# Patient Record
Sex: Female | Born: 1955
Health system: Southern US, Community
[De-identification: ages and names within clinical notes are randomized; demographics above are authoritative.]

## PROBLEM LIST (undated history)

## (undated) DIAGNOSIS — M1990 Unspecified osteoarthritis, unspecified site: Secondary | ICD-10-CM

## (undated) DIAGNOSIS — T4145XA Adverse effect of unspecified anesthetic, initial encounter: Secondary | ICD-10-CM

## (undated) DIAGNOSIS — R51 Headache: Secondary | ICD-10-CM

## (undated) DIAGNOSIS — T8859XA Other complications of anesthesia, initial encounter: Secondary | ICD-10-CM

## (undated) DIAGNOSIS — K219 Gastro-esophageal reflux disease without esophagitis: Secondary | ICD-10-CM

## (undated) DIAGNOSIS — Z9889 Other specified postprocedural states: Secondary | ICD-10-CM

## (undated) DIAGNOSIS — I341 Nonrheumatic mitral (valve) prolapse: Secondary | ICD-10-CM

## (undated) DIAGNOSIS — R011 Cardiac murmur, unspecified: Secondary | ICD-10-CM

## (undated) DIAGNOSIS — F419 Anxiety disorder, unspecified: Secondary | ICD-10-CM

## (undated) DIAGNOSIS — R519 Headache, unspecified: Secondary | ICD-10-CM

## (undated) DIAGNOSIS — C801 Malignant (primary) neoplasm, unspecified: Secondary | ICD-10-CM

## (undated) DIAGNOSIS — R112 Nausea with vomiting, unspecified: Secondary | ICD-10-CM

## (undated) HISTORY — DX: Unspecified osteoarthritis, unspecified site: M19.90

## (undated) HISTORY — PX: JOINT REPLACEMENT: SHX530

## (undated) HISTORY — PX: OOPHORECTOMY: SHX86

## (undated) HISTORY — PX: FRACTURE SURGERY: SHX138

## (undated) HISTORY — DX: Malignant (primary) neoplasm, unspecified: C80.1

## (undated) HISTORY — PX: TOTAL HIP ARTHROPLASTY: SHX124

---

## 1988-03-31 HISTORY — PX: PELVIC LAPAROSCOPY: SHX162

## 1989-03-31 HISTORY — PX: ABDOMINAL HYSTERECTOMY: SHX81

## 1997-07-10 ENCOUNTER — Other Ambulatory Visit: Admission: RE | Admit: 1997-07-10 | Discharge: 1997-07-10 | Payer: Self-pay | Admitting: Obstetrics and Gynecology

## 1998-08-16 ENCOUNTER — Other Ambulatory Visit: Admission: RE | Admit: 1998-08-16 | Discharge: 1998-08-16 | Payer: Self-pay | Admitting: Obstetrics and Gynecology

## 2001-08-10 ENCOUNTER — Other Ambulatory Visit: Admission: RE | Admit: 2001-08-10 | Discharge: 2001-08-10 | Payer: Self-pay | Admitting: Obstetrics and Gynecology

## 2002-08-23 ENCOUNTER — Other Ambulatory Visit: Admission: RE | Admit: 2002-08-23 | Discharge: 2002-08-23 | Payer: Self-pay | Admitting: Obstetrics and Gynecology

## 2003-05-23 ENCOUNTER — Encounter (HOSPITAL_COMMUNITY): Admission: RE | Admit: 2003-05-23 | Discharge: 2003-08-21 | Payer: Self-pay | Admitting: Radiology

## 2003-08-29 ENCOUNTER — Other Ambulatory Visit: Admission: RE | Admit: 2003-08-29 | Discharge: 2003-08-29 | Payer: Self-pay | Admitting: Obstetrics and Gynecology

## 2004-09-02 ENCOUNTER — Other Ambulatory Visit: Admission: RE | Admit: 2004-09-02 | Discharge: 2004-09-02 | Payer: Self-pay | Admitting: Obstetrics and Gynecology

## 2005-09-03 ENCOUNTER — Other Ambulatory Visit: Admission: RE | Admit: 2005-09-03 | Discharge: 2005-09-03 | Payer: Self-pay | Admitting: Obstetrics and Gynecology

## 2006-04-09 ENCOUNTER — Ambulatory Visit: Payer: Self-pay | Admitting: Internal Medicine

## 2006-05-01 ENCOUNTER — Ambulatory Visit: Payer: Self-pay | Admitting: Internal Medicine

## 2006-09-22 ENCOUNTER — Other Ambulatory Visit: Admission: RE | Admit: 2006-09-22 | Discharge: 2006-09-22 | Payer: Self-pay | Admitting: Obstetrics and Gynecology

## 2007-12-27 ENCOUNTER — Other Ambulatory Visit: Admission: RE | Admit: 2007-12-27 | Discharge: 2007-12-27 | Payer: Self-pay | Admitting: Obstetrics and Gynecology

## 2007-12-27 ENCOUNTER — Ambulatory Visit: Payer: Self-pay | Admitting: Obstetrics and Gynecology

## 2007-12-27 ENCOUNTER — Encounter: Payer: Self-pay | Admitting: Obstetrics and Gynecology

## 2008-04-14 ENCOUNTER — Ambulatory Visit: Payer: Self-pay | Admitting: Unknown Physician Specialty

## 2009-03-31 HISTORY — PX: FACIAL COSMETIC SURGERY: SHX629

## 2009-04-16 ENCOUNTER — Other Ambulatory Visit: Admission: RE | Admit: 2009-04-16 | Discharge: 2009-04-16 | Payer: Self-pay | Admitting: Obstetrics and Gynecology

## 2009-04-16 ENCOUNTER — Ambulatory Visit: Payer: Self-pay | Admitting: Obstetrics and Gynecology

## 2010-03-31 DIAGNOSIS — C801 Malignant (primary) neoplasm, unspecified: Secondary | ICD-10-CM

## 2010-03-31 HISTORY — PX: OTHER SURGICAL HISTORY: SHX169

## 2010-03-31 HISTORY — DX: Malignant (primary) neoplasm, unspecified: C80.1

## 2010-05-23 ENCOUNTER — Encounter: Payer: Self-pay | Admitting: Obstetrics and Gynecology

## 2010-05-29 ENCOUNTER — Other Ambulatory Visit (HOSPITAL_COMMUNITY)
Admission: RE | Admit: 2010-05-29 | Discharge: 2010-05-29 | Disposition: A | Discharge status: Home / Self Care | Payer: BC Managed Care – PPO | Source: Ambulatory Visit | Attending: Obstetrics and Gynecology | Admitting: Obstetrics and Gynecology

## 2010-05-29 ENCOUNTER — Other Ambulatory Visit: Payer: Self-pay | Admitting: Obstetrics and Gynecology

## 2010-05-29 ENCOUNTER — Encounter (INDEPENDENT_AMBULATORY_CARE_PROVIDER_SITE_OTHER): Payer: BC Managed Care – PPO | Admitting: Obstetrics and Gynecology

## 2010-05-29 DIAGNOSIS — Z01419 Encounter for gynecological examination (general) (routine) without abnormal findings: Secondary | ICD-10-CM

## 2010-05-29 DIAGNOSIS — Z124 Encounter for screening for malignant neoplasm of cervix: Secondary | ICD-10-CM | POA: Insufficient documentation

## 2011-01-14 ENCOUNTER — Encounter: Payer: Self-pay | Admitting: Obstetrics and Gynecology

## 2011-06-09 DIAGNOSIS — N809 Endometriosis, unspecified: Secondary | ICD-10-CM | POA: Insufficient documentation

## 2011-06-09 DIAGNOSIS — M1990 Unspecified osteoarthritis, unspecified site: Secondary | ICD-10-CM | POA: Insufficient documentation

## 2011-06-18 ENCOUNTER — Ambulatory Visit (INDEPENDENT_AMBULATORY_CARE_PROVIDER_SITE_OTHER): Payer: BC Managed Care – PPO | Admitting: Obstetrics and Gynecology

## 2011-06-18 ENCOUNTER — Encounter: Payer: Self-pay | Admitting: Obstetrics and Gynecology

## 2011-06-18 VITALS — BP 120/76 | Ht 65.0 in | Wt 122.0 lb

## 2011-06-18 DIAGNOSIS — Z01419 Encounter for gynecological examination (general) (routine) without abnormal findings: Secondary | ICD-10-CM

## 2011-06-18 MED ORDER — ESTRADIOL 2 MG VA RING: 2.0000 mg | VAGINAL_RING | VAGINAL | Status: DC

## 2011-06-18 MED ORDER — ESTRADIOL 1 MG PO TABS: 1.0000 mg | ORAL_TABLET | Freq: Every day | ORAL | Status: DC

## 2011-06-18 NOTE — Patient Instructions (Signed)
Scheduled bone density

## 2011-06-18 NOTE — Progress Notes (Signed)
Patient came to see me today for her annual GYN exam. She continues with the Estring with excellent results. She's also doing oral estradiol for sense of well-being, joint pain, and bone protection. She does that about 3 times a week. I couldn't really get a feeling from her whether she thought it was  making a difference. She's having no vaginal bleeding. She is having no pelvic pain. She is up-to-date on mammograms. When she came in last year we discussed drug holiday from Fosamax as she had been on more than 5 years. Dr. Hyacinth Meeker who was given to her want her to continue. She however decided to stop. She has avascular necrosis of the hip and the orthopedist was concerned with her continuing as well. She is due for followup bone density.  HEENT: Within normal limits.  Kennon Portela present. Neck: No masses. Supraclavicular lymph nodes: Not enlarged. Breasts: Examined in both sitting and lying position. Symmetrical without skin changes or masses. Abdomen: Soft no masses guarding or rebound. No hernias. Pelvic: External within normal limits. BUS within normal limits. Vaginal examination shows good estrogen effect, no cystocele enterocele or rectocele. Cervix and uterus absent. Adnexa within normal limits. Rectovaginal confirmatory. Extremities within normal limits.  Assessment: #1. Atrophic vaginitis #2. Menopausal symptoms #3. Osteoporosis  Plan: Continue yearly mammograms. Followup bone density. Continue Estring vaginal ring. Patient to discontinue oral estradiol for about a month to see if it's making a difference. It is she will reinstitute it and take the smallest dose which is effective.

## 2011-06-19 LAB — URINALYSIS W MICROSCOPIC + REFLEX CULTURE
Casts: NONE SEEN
Crystals: NONE SEEN
Leukocytes, UA: NEGATIVE
Nitrite: NEGATIVE
Specific Gravity, Urine: 1.011 (ref 1.005–1.030)
Urobilinogen, UA: 0.2 mg/dL (ref 0.0–1.0)
pH: 7.5 (ref 5.0–8.0)

## 2012-01-13 ENCOUNTER — Encounter: Payer: Self-pay | Admitting: Obstetrics and Gynecology

## 2012-10-27 ENCOUNTER — Ambulatory Visit (INDEPENDENT_AMBULATORY_CARE_PROVIDER_SITE_OTHER): Payer: BC Managed Care – PPO | Admitting: Women's Health

## 2012-10-27 ENCOUNTER — Encounter: Payer: Self-pay | Admitting: Women's Health

## 2012-10-27 VITALS — BP 112/70 | Ht 64.75 in | Wt 126.0 lb

## 2012-10-27 DIAGNOSIS — Z01419 Encounter for gynecological examination (general) (routine) without abnormal findings: Secondary | ICD-10-CM

## 2012-10-27 NOTE — Progress Notes (Signed)
Jennifer Hammond 14-Feb-1956 [id number]    History:    The patient presents for annual exam.  TAH with BSO 1991 for endometriosis. On 1 mg estradiol 2-3 times a week and Estring. Melanoma 2012, upper chest with every six-month followup. History of osteoporosis was on Fosamax for 5 years stopped in 2013. DEXA followed [facility] clinic. History of a benign colon polyp due next year for repeat colonoscopy. Normal mammograms, had a right breast cyst that was followed in 2006, has had 3D tomography with mammograms due to density.   Past medical history, past surgical history, family history and social history were all reviewed and documented in the EPIC chart. Works in Community education officer at [employer]. Husband a rectal dysfunction. 2 adopted children ages 68 and 85 doing well.   ROS:  A  ROS was performed and pertinent positives and negatives are included in the history.  Exam:  Filed Vitals:   10/27/12 1200  BP: 112/70    General appearance:  Normal Head/Neck:  Normal, without cervical or supraclavicular adenopathy. Thyroid:  Symmetrical, normal in size, without palpable masses or nodularity. Respiratory  Effort:  Normal  Auscultation:  Clear without wheezing or rhonchi Cardiovascular  Auscultation:  Regular rate, without rubs, murmurs or gallops  Edema/varicosities:  Not grossly evident Abdominal  Soft,nontender, without masses, guarding or rebound.  Liver/spleen:  No organomegaly noted  Hernia:  None appreciated  Skin  Inspection:  Grossly normal  Palpation:  Grossly normal Neurologic/psychiatric  Orientation:  Normal with appropriate conversation.  Mood/affect:  Normal  Genitourinary    Breasts: Examined lying and sitting.     Right: Without masses, retractions, discharge or axillary adenopathy.     Left: Without masses, retractions, discharge or axillary adenopathy.   Inguinal/mons:  Normal without inguinal adenopathy  External genitalia:  Normal  BUS/Urethra/Skene's glands:   Normal  Bladder:  Normal  Vagina:  Normal  Cervix:  absent  Uterus:  absent Adnexa/parametria:     Rt: Without masses or tenderness.   Lt: Without masses or tenderness.  Anus and perineum: Normal  Digital rectal exam: Normal sphincter tone without palpated masses or tenderness  Assessment/Plan:  57 y.o. M. WF G0, 2 adopted for annual exam with no complaints.  TAH with BSO endometriosis on Estring and estradiol Osteoporosis /osteoarthritis/ followed by primary care- labs and meds  Plan: Estring 2 mg every 3 months, prescription, proper use reviewed, states prevents dryness with itching. Estradiol 1 mg states feels better when she takes several times per week. Reviewed slight risk for blood clots, strokes, breast cancer. SBE's, continue annual mammogram, continue 3-D tomography due to density of breasts. Continue regular exercise, calcium rich diet and vitamin D 2000 daily.   Harrington Challenger WHNP, 1:00 PM 10/27/2012

## 2012-10-27 NOTE — Patient Instructions (Signed)
Health Recommendations for Postmenopausal Women Respected and ongoing research has looked at the most common causes of death, disability, and poor quality of life in postmenopausal women. The causes include heart disease, diseases of blood vessels, diabetes, depression, cancer, and bone loss (osteoporosis). Many things can be done to help lower the chances of developing these and other common problems: CARDIOVASCULAR DISEASE Heart Disease: A heart attack is a medical emergency. Know the signs and symptoms of a heart attack. Below are things women can do to reduce their risk for heart disease.   Do not smoke. If you smoke, quit.  Aim for a healthy weight. Being overweight causes many preventable deaths. Eat a healthy and balanced diet and drink an adequate amount of liquids.  Get moving. Make a commitment to be more physically active. Aim for 30 minutes of activity on most, if not all days of the week.  Eat for heart health. Choose a diet that is low in saturated fat and cholesterol and eliminate trans fat. Include whole grains, vegetables, and fruits. Read and understand the labels on food containers before buying.  Know your numbers. Ask your caregiver to check your blood pressure, cholesterol (total, HDL, LDL, triglycerides) and blood glucose. Work with your caregiver on improving your entire clinical picture.  High blood pressure. Limit or stop your table salt intake (try salt substitute and food seasonings). Avoid salty foods and drinks. Read labels on food containers before buying. Eating well and exercising can help control high blood pressure. STROKE  Stroke is a medical emergency. Stroke may be the result of a blood clot in a blood vessel in the brain or by a brain hemorrhage (bleeding). Know the signs and symptoms of a stroke. To lower the risk of developing a stroke:  Avoid fatty foods.  Quit smoking.  Control your diabetes, blood pressure, and irregular heart rate. THROMBOPHLEBITIS  (BLOOD CLOT) OF THE LEG  Becoming overweight and leading a stationary lifestyle may also contribute to developing blood clots. Controlling your diet and exercising will help lower the risk of developing blood clots. CANCER SCREENING  Breast Cancer: Take steps to reduce your risk of breast cancer.  You should practice "breast self-awareness." This means understanding the normal appearance and feel of your breasts and should include breast self-examination. Any changes detected, no matter how small, should be reported to your caregiver.  After age 40, you should have a clinical breast exam (CBE) every year.  Starting at age 40, you should consider having a mammogram (breast X-ray) every year.  If you have a family history of breast cancer, talk to your caregiver about genetic screening.  If you are at high risk for breast cancer, talk to your caregiver about having an MRI and a mammogram every year.  Intestinal or Stomach Cancer: Tests to consider are a rectal exam, fecal occult blood, sigmoidoscopy, and colonoscopy. Women who are high risk may need to be screened at an earlier age and more often.  Cervical Cancer:  Beginning at age 30, you should have a Pap test every 3 years as long as the past 3 Pap tests have been normal.  If you have had past treatment for cervical cancer or a condition that could lead to cancer, you need Pap tests and screening for cancer for at least 20 years after your treatment.  If you had a hysterectomy for a problem that was not cancer or a condition that could lead to cancer, then you no longer need Pap tests.    If you are between ages 65 and 70, and you have had normal Pap tests going back 10 years, you no longer need Pap tests.  If Pap tests have been discontinued, risk factors (such as a new sexual partner) need to be reassessed to determine if screening should be resumed.  Some medical problems can increase the chance of getting cervical cancer. In these  cases, your caregiver may recommend more frequent screening and Pap tests.  Uterine Cancer: If you have vaginal bleeding after reaching menopause, you should notify your caregiver.  Ovarian cancer: Other than yearly pelvic exams, there are no reliable tests available to screen for ovarian cancer at this time except for yearly pelvic exams.  Lung Cancer: Yearly chest X-rays can detect lung cancer and should be done on high risk women, such as cigarette smokers and women with chronic lung disease (emphysema).  Skin Cancer: A complete body skin exam should be done at your yearly examination. Avoid overexposure to the sun and ultraviolet light lamps. Use a strong sun block cream when in the sun. All of these things are important in lowering the risk of skin cancer. MENOPAUSE Menopause Symptoms: Hormone therapy products are effective for treating symptoms associated with menopause:  Moderate to severe hot flashes.  Night sweats.  Mood swings.  Headaches.  Tiredness.  Loss of sex drive.  Insomnia.  Other symptoms. Hormone replacement carries certain risks, especially in older women. Women who use or are thinking about using estrogen or estrogen with progestin treatments should discuss that with their caregiver. Your caregiver will help you understand the benefits and risks. The ideal dose of hormone replacement therapy is not known. The Food and Drug Administration (FDA) has concluded that hormone therapy should be used only at the lowest doses and for the shortest amount of time to reach treatment goals.  OSTEOPOROSIS Protecting Against Bone Loss and Preventing Fracture: If you use hormone therapy for prevention of bone loss (osteoporosis), the risks for bone loss must outweigh the risk of the therapy. Ask your caregiver about other medications known to be safe and effective for preventing bone loss and fractures. To guard against bone loss or fractures, the following is recommended:  If  you are less than age 50, take 1000 mg of calcium and at least 600 mg of Vitamin D per day.  If you are greater than age 50 but less than age 70, take 1200 mg of calcium and at least 600 mg of Vitamin D per day.  If you are greater than age 70, take 1200 mg of calcium and at least 800 mg of Vitamin D per day. Smoking and excessive alcohol intake increases the risk of osteoporosis. Eat foods rich in calcium and vitamin D and do weight bearing exercises several times a week as your caregiver suggests. DIABETES Diabetes Melitus: If you have Type I or Type 2 diabetes, you should keep your blood sugar under control with diet, exercise and recommended medication. Avoid too many sweets, starchy and fatty foods. Being overweight can make control more difficult. COGNITION AND MEMORY Cognition and Memory: Menopausal hormone therapy is not recommended for the prevention of cognitive disorders such as Alzheimer's disease or memory loss.  DEPRESSION  Depression may occur at any age, but is common in elderly women. The reasons may be because of physical, medical, social (loneliness), or financial problems and needs. If you are experiencing depression because of medical problems and control of symptoms, talk to your caregiver about this. Physical activity and   exercise may help with mood and sleep. Community and volunteer involvement may help your sense of value and worth. If you have depression and you feel that the problem is getting worse or becoming severe, talk to your caregiver about treatment options that are best for you. ACCIDENTS  Accidents are common and can be serious in the elderly woman. Prepare your house to prevent accidents. Eliminate throw rugs, place hand bars in the bath, shower and toilet areas. Avoid wearing high heeled shoes or walking on wet, snowy, and icy areas. Limit or stop driving if you have vision or hearing problems, or you feel you are unsteady with you movements and  reflexes. HEPATITIS C Hepatitis C is a type of viral infection affecting the liver. It is spread mainly through contact with blood from an infected person. It can be treated, but if left untreated, it can lead to severe liver damage over years. Many people who are infected do not know that the virus is in their blood. If you are a "baby-boomer", it is recommended that you have one screening test for Hepatitis C. IMMUNIZATIONS  Several immunizations are important to consider having during your senior years, including:   Tetanus, diptheria, and pertussis booster shot.  Influenza every year before the flu season begins.  Pneumonia vaccine.  Shingles vaccine.  Others as indicated based on your specific needs. Talk to your caregiver about these. Document Released: 05/09/2005 Document Revised: 03/03/2012 Document Reviewed: 01/03/2008 Exit[employer] Patient Information 2014 [employer].  

## 2013-04-26 ENCOUNTER — Telehealth: Payer: Self-pay | Admitting: *Deleted

## 2013-04-26 NOTE — Telephone Encounter (Signed)
Pt called requesting refill on estring 2 mg, pt annual due in July 2015, left message for pt to call with which pharmacy to send Rx.

## 2013-04-28 MED ORDER — ESTRADIOL 2 MG VA RING
2.0000 mg | VAGINAL_RING | VAGINAL | Status: DC
Start: 2013-04-28 — End: 2013-11-04

## 2013-04-28 NOTE — Telephone Encounter (Signed)
Pt called back and would like rx sent to mail order. rx sent

## 2013-06-21 ENCOUNTER — Encounter: Payer: Self-pay | Admitting: Women's Health

## 2013-07-11 ENCOUNTER — Ambulatory Visit: Payer: Self-pay | Admitting: Unknown Physician Specialty

## 2013-07-14 LAB — PATHOLOGY REPORT

## 2013-11-04 ENCOUNTER — Encounter: Payer: Self-pay | Admitting: Women's Health

## 2013-11-04 ENCOUNTER — Ambulatory Visit (INDEPENDENT_AMBULATORY_CARE_PROVIDER_SITE_OTHER): Payer: BC Managed Care – PPO | Admitting: Women's Health

## 2013-11-04 VITALS — BP 110/68 | Ht 65.0 in | Wt 123.4 lb

## 2013-11-04 DIAGNOSIS — Z01419 Encounter for gynecological examination (general) (routine) without abnormal findings: Secondary | ICD-10-CM

## 2013-11-04 DIAGNOSIS — Z79890 Hormone replacement therapy (postmenopausal): Secondary | ICD-10-CM

## 2013-11-04 MED ORDER — ESTRADIOL 1 MG PO TABS: 1.0000 mg | ORAL_TABLET | Freq: Every day | ORAL | Status: DC

## 2013-11-04 MED ORDER — ESTRADIOL 2 MG VA RING: 2.0000 mg | VAGINAL_RING | VAGINAL | Status: DC

## 2013-11-04 NOTE — Progress Notes (Signed)
Jennifer Hammond [birth year] [id number]    History:    Presents for annual exam.  1991 TAH with BSO for endometriosis on estradiol 1 mg 2-3 times weekly and Estring. Normal Pap and mammogram history. Melanoma 2012 every six-month skin checks. DEXA primary care for osteoporosis, Fosamax for 5 years until 2013. Not sexually active has been with erectile dysfunction. Negative colonoscopy 2015.  Past medical history, past surgical history, family history and social history were all reviewed and documented in the EPIC chart. Works at [employer], residence living. 2 adopted children ages 45 and 72.  ROS:  A  12 point ROS was performed and pertinent positives and negatives are included.  Exam:  Filed Vitals:   11/04/13 0925  BP: 110/68    General appearance:  Normal Thyroid:  Symmetrical, normal in size, without palpable masses or nodularity. Respiratory  Auscultation:  Clear without wheezing or rhonchi Cardiovascular  Auscultation:  Regular rate, without rubs, murmurs or gallops  Edema/varicosities:  Not grossly evident Abdominal  Soft,nontender, without masses, guarding or rebound.  Liver/spleen:  No organomegaly noted  Hernia:  None appreciated  Skin  Inspection:  Grossly normal   Breasts: Examined lying and sitting.     Right: Without masses, retractions, discharge or axillary adenopathy.     Left: Without masses, retractions, discharge or axillary adenopathy. Gentitourinary   Inguinal/mons:  Normal without inguinal adenopathy  External genitalia:  Normal  BUS/Urethra/Skene's glands:  Normal  Vagina:  Normal  Cervix: Absent  Uterus:  Absent Adnexa/parametria:     Rt: Without masses or tenderness.   Lt: Without masses or tenderness.  Anus and perineum: Normal  Digital rectal exam: Normal sphincter tone without palpated masses or tenderness  Assessment/Plan:  58 y.o. MWF G0 +2 adopted children for annual exam with no complaints.  TAH with BSO for endometriosis on estradiol and  Estring Melanoma  Osteoporosis-primary care  Plan:. Estradiol 1 mg to 3 times weekly reviewed taking half tablet 3 times weekly to gradually decreased to stop HRT, reviewed blood clots, strokes, breast cancer risk with HRT. Estring 2 mg every 12 weeks prescription, proper use given and reviewed. SBE's, continue annual mammogram 3-D tomography history of dense breast. Regular exercise, calcium rich diet, vitamin D 2000 daily encouraged. Continue every six-month skin check. Home safety, fall prevention and importance of regular exercise for bone health reviewed.   Note: This dictation was prepared with Dragon/digital dictation.  Any transcriptional errors that result are unintentional. Huel Cote [facility], 1:43 PM 11/04/2013

## 2013-11-04 NOTE — Patient Instructions (Signed)
Health Recommendations for Postmenopausal Women Respected and ongoing research has looked at the most common causes of death, disability, and poor quality of life in postmenopausal women. The causes include heart disease, diseases of blood vessels, diabetes, depression, cancer, and bone loss (osteoporosis). Many things can be done to help lower the chances of developing these and other common problems. CARDIOVASCULAR DISEASE Heart Disease: A heart attack is a medical emergency. Know the signs and symptoms of a heart attack. Below are things women can do to reduce their risk for heart disease.   Do not smoke. If you smoke, quit.  Aim for a healthy weight. Being overweight causes many preventable deaths. Eat a healthy and balanced diet and drink an adequate amount of liquids.  Get moving. Make a commitment to be more physically active. Aim for 30 minutes of activity on most, if not all days of the week.  Eat for heart health. Choose a diet that is low in saturated fat and cholesterol and eliminate trans fat. Include whole grains, vegetables, and fruits. Read and understand the labels on food containers before buying.  Know your numbers. Ask your caregiver to check your blood pressure, cholesterol (total, HDL, LDL, triglycerides) and blood glucose. Work with your caregiver on improving your entire clinical picture.  High blood pressure. Limit or stop your table salt intake (try salt substitute and food seasonings). Avoid salty foods and drinks. Read labels on food containers before buying. Eating well and exercising can help control high blood pressure. STROKE  Stroke is a medical emergency. Stroke may be the result of a blood clot in a blood vessel in the brain or by a brain hemorrhage (bleeding). Know the signs and symptoms of a stroke. To lower the risk of developing a stroke:  Avoid fatty foods.  Quit smoking.  Control your diabetes, blood pressure, and irregular heart rate. THROMBOPHLEBITIS  (BLOOD CLOT) OF THE LEG  Becoming overweight and leading a stationary lifestyle may also contribute to developing blood clots. Controlling your diet and exercising will help lower the risk of developing blood clots. CANCER SCREENING  Breast Cancer: Take steps to reduce your risk of breast cancer.  You should practice "breast self-awareness." This means understanding the normal appearance and feel of your breasts and should include breast self-examination. Any changes detected, no matter how small, should be reported to your caregiver.  After age 40, you should have a clinical breast exam (CBE) every year.  Starting at age 40, you should consider having a mammogram (breast X-ray) every year.  If you have a family history of breast cancer, talk to your caregiver about genetic screening.  If you are at high risk for breast cancer, talk to your caregiver about having an MRI and a mammogram every year.  Intestinal or Stomach Cancer: Tests to consider are a rectal exam, fecal occult blood, sigmoidoscopy, and colonoscopy. Women who are high risk may need to be screened at an earlier age and more often.  Cervical Cancer:  Beginning at age 30, you should have a Pap test every 3 years as long as the past 3 Pap tests have been normal.  If you have had past treatment for cervical cancer or a condition that could lead to cancer, you need Pap tests and screening for cancer for at least 20 years after your treatment.  If you had a hysterectomy for a problem that was not cancer or a condition that could lead to cancer, then you no longer need Pap tests.    If you are between ages 65 and 70, and you have had normal Pap tests going back 10 years, you no longer need Pap tests.  If Pap tests have been discontinued, risk factors (such as a new sexual partner) need to be reassessed to determine if screening should be resumed.  Some medical problems can increase the chance of getting cervical cancer. In these  cases, your caregiver may recommend more frequent screening and Pap tests.  Uterine Cancer: If you have vaginal bleeding after reaching menopause, you should notify your caregiver.  Ovarian Cancer: Other than yearly pelvic exams, there are no reliable tests available to screen for ovarian cancer at this time except for yearly pelvic exams.  Lung Cancer: Yearly chest X-rays can detect lung cancer and should be done on high risk women, such as cigarette smokers and women with chronic lung disease (emphysema).  Skin Cancer: A complete body skin exam should be done at your yearly examination. Avoid overexposure to the sun and ultraviolet light lamps. Use a strong sun block cream when in the sun. All of these things are important for lowering the risk of skin cancer. MENOPAUSE Menopause Symptoms: Hormone therapy products are effective for treating symptoms associated with menopause:  Moderate to severe hot flashes.  Night sweats.  Mood swings.  Headaches.  Tiredness.  Loss of sex drive.  Insomnia.  Other symptoms. Hormone replacement carries certain risks, especially in older women. Women who use or are thinking about using estrogen or estrogen with progestin treatments should discuss that with their caregiver. Your caregiver will help you understand the benefits and risks. The ideal dose of hormone replacement therapy is not known. The Food and Drug Administration (FDA) has concluded that hormone therapy should be used only at the lowest doses and for the shortest amount of time to reach treatment goals.  OSTEOPOROSIS Protecting Against Bone Loss and Preventing Fracture If you use hormone therapy for prevention of bone loss (osteoporosis), the risks for bone loss must outweigh the risk of the therapy. Ask your caregiver about other medications known to be safe and effective for preventing bone loss and fractures. To guard against bone loss or fractures, the following is recommended:  If  you are younger than age 50, take 1000 mg of calcium and at least 600 mg of Vitamin D per day.  If you are older than age 50 but younger than age 70, take 1200 mg of calcium and at least 600 mg of Vitamin D per day.  If you are older than age 70, take 1200 mg of calcium and at least 800 mg of Vitamin D per day. Smoking and excessive alcohol intake increases the risk of osteoporosis. Eat foods rich in calcium and vitamin D and do weight bearing exercises several times a week as your caregiver suggests. DIABETES Diabetes Mellitus: If you have type I or type 2 diabetes, you should keep your blood sugar under control with diet, exercise, and recommended medication. Avoid starchy and fatty foods, and too many sweets. Being overweight can make diabetes control more difficult. COGNITION AND MEMORY Cognition and Memory: Menopausal hormone therapy is not recommended for the prevention of cognitive disorders such as Alzheimer's disease or memory loss.  DEPRESSION  Depression may occur at any age, but it is common in elderly women. This may be because of physical, medical, social (loneliness), or financial problems and needs. If you are experiencing depression because of medical problems and control of symptoms, talk to your caregiver about this. Physical   activity and exercise may help with mood and sleep. Community and volunteer involvement may improve your sense of value and worth. If you have depression and you feel that the problem is getting worse or becoming severe, talk to your caregiver about which treatment options are best for you. ACCIDENTS  Accidents are common and can be serious in elderly woman. Prepare your house to prevent accidents. Eliminate throw rugs, place hand bars in bath, shower, and toilet areas. Avoid wearing high heeled shoes or walking on wet, snowy, and icy areas. Limit or stop driving if you have vision or hearing problems, or if you feel you are unsteady with your movements and  reflexes. HEPATITIS C Hepatitis C is a type of viral infection affecting the liver. It is spread mainly through contact with blood from an infected person. It can be treated, but if left untreated, it can lead to severe liver damage over the years. Many people who are infected do not know that the virus is in their blood. If you are a "baby-boomer", it is recommended that you have one screening test for Hepatitis C. IMMUNIZATIONS  Several immunizations are important to consider having during your senior years, including:   Tetanus, diphtheria, and pertussis booster shot.  Influenza every year before the flu season begins.  Pneumonia vaccine.  Shingles vaccine.  Others, as indicated based on your specific needs. Talk to your caregiver about these. Document Released: 05/09/2005 Document Revised: 08/01/2013 Document Reviewed: 01/03/2008 [employer] Patient Information 2015 [employer]. This information is not intended to replace advice given to you by your health care provider. Make sure you discuss any questions you have with your health care provider.  

## 2014-07-28 ENCOUNTER — Encounter: Payer: Self-pay | Admitting: Women's Health

## 2014-11-06 ENCOUNTER — Ambulatory Visit (INDEPENDENT_AMBULATORY_CARE_PROVIDER_SITE_OTHER): Payer: BLUE CROSS/BLUE SHIELD | Admitting: Women's Health

## 2014-11-06 ENCOUNTER — Encounter: Payer: Self-pay | Admitting: Women's Health

## 2014-11-06 VITALS — BP 120/78 | Ht 64.5 in | Wt 126.0 lb

## 2014-11-06 DIAGNOSIS — Z79890 Hormone replacement therapy (postmenopausal): Secondary | ICD-10-CM | POA: Diagnosis not present

## 2014-11-06 DIAGNOSIS — Z01419 Encounter for gynecological examination (general) (routine) without abnormal findings: Secondary | ICD-10-CM | POA: Diagnosis not present

## 2014-11-06 MED ORDER — ESTRADIOL 2 MG VA RING: 2.0000 mg | VAGINAL_RING | VAGINAL | Status: DC

## 2014-11-06 NOTE — Patient Instructions (Signed)
Health Recommendations for Postmenopausal Women Respected and ongoing research has looked at the most common causes of death, disability, and poor quality of life in postmenopausal women. The causes include heart disease, diseases of blood vessels, diabetes, depression, cancer, and bone loss (osteoporosis). Many things can be done to help lower the chances of developing these and other common problems. CARDIOVASCULAR DISEASE Heart Disease: A heart attack is a medical emergency. Know the signs and symptoms of a heart attack. Below are things women can do to reduce their risk for heart disease.   Do not smoke. If you smoke, quit.  Aim for a healthy weight. Being overweight causes many preventable deaths. Eat a healthy and balanced diet and drink an adequate amount of liquids.  Get moving. Make a commitment to be more physically active. Aim for 30 minutes of activity on most, if not all days of the week.  Eat for heart health. Choose a diet that is low in saturated fat and cholesterol and eliminate trans fat. Include whole grains, vegetables, and fruits. Read and understand the labels on food containers before buying.  Know your numbers. Ask your caregiver to check your blood pressure, cholesterol (total, HDL, LDL, triglycerides) and blood glucose. Work with your caregiver on improving your entire clinical picture.  High blood pressure. Limit or stop your table salt intake (try salt substitute and food seasonings). Avoid salty foods and drinks. Read labels on food containers before buying. Eating well and exercising can help control high blood pressure. STROKE  Stroke is a medical emergency. Stroke may be the result of a blood clot in a blood vessel in the brain or by a brain hemorrhage (bleeding). Know the signs and symptoms of a stroke. To lower the risk of developing a stroke:  Avoid fatty foods.  Quit smoking.  Control your diabetes, blood pressure, and irregular heart rate. THROMBOPHLEBITIS  (BLOOD CLOT) OF THE LEG  Becoming overweight and leading a stationary lifestyle may also contribute to developing blood clots. Controlling your diet and exercising will help lower the risk of developing blood clots. CANCER SCREENING  Breast Cancer: Take steps to reduce your risk of breast cancer.  You should practice "breast self-awareness." This means understanding the normal appearance and feel of your breasts and should include breast self-examination. Any changes detected, no matter how small, should be reported to your caregiver.  After age 40, you should have a clinical breast exam (CBE) every year.  Starting at age 40, you should consider having a mammogram (breast X-ray) every year.  If you have a family history of breast cancer, talk to your caregiver about genetic screening.  If you are at high risk for breast cancer, talk to your caregiver about having an MRI and a mammogram every year.  Intestinal or Stomach Cancer: Tests to consider are a rectal exam, fecal occult blood, sigmoidoscopy, and colonoscopy. Women who are high risk may need to be screened at an earlier age and more often.  Cervical Cancer:  Beginning at age 30, you should have a Pap test every 3 years as long as the past 3 Pap tests have been normal.  If you have had past treatment for cervical cancer or a condition that could lead to cancer, you need Pap tests and screening for cancer for at least 20 years after your treatment.  If you had a hysterectomy for a problem that was not cancer or a condition that could lead to cancer, then you no longer need Pap tests.    If you are between ages 65 and 70, and you have had normal Pap tests going back 10 years, you no longer need Pap tests.  If Pap tests have been discontinued, risk factors (such as a new sexual partner) need to be reassessed to determine if screening should be resumed.  Some medical problems can increase the chance of getting cervical cancer. In these  cases, your caregiver may recommend more frequent screening and Pap tests.  Uterine Cancer: If you have vaginal bleeding after reaching menopause, you should notify your caregiver.  Ovarian Cancer: Other than yearly pelvic exams, there are no reliable tests available to screen for ovarian cancer at this time except for yearly pelvic exams.  Lung Cancer: Yearly chest X-rays can detect lung cancer and should be done on high risk women, such as cigarette smokers and women with chronic lung disease (emphysema).  Skin Cancer: A complete body skin exam should be done at your yearly examination. Avoid overexposure to the sun and ultraviolet light lamps. Use a strong sun block cream when in the sun. All of these things are important for lowering the risk of skin cancer. MENOPAUSE Menopause Symptoms: Hormone therapy products are effective for treating symptoms associated with menopause:  Moderate to severe hot flashes.  Night sweats.  Mood swings.  Headaches.  Tiredness.  Loss of sex drive.  Insomnia.  Other symptoms. Hormone replacement carries certain risks, especially in older women. Women who use or are thinking about using estrogen or estrogen with progestin treatments should discuss that with their caregiver. Your caregiver will help you understand the benefits and risks. The ideal dose of hormone replacement therapy is not known. The Food and Drug Administration (FDA) has concluded that hormone therapy should be used only at the lowest doses and for the shortest amount of time to reach treatment goals.  OSTEOPOROSIS Protecting Against Bone Loss and Preventing Fracture If you use hormone therapy for prevention of bone loss (osteoporosis), the risks for bone loss must outweigh the risk of the therapy. Ask your caregiver about other medications known to be safe and effective for preventing bone loss and fractures. To guard against bone loss or fractures, the following is recommended:  If  you are younger than age 50, take 1000 mg of calcium and at least 600 mg of Vitamin D per day.  If you are older than age 50 but younger than age 70, take 1200 mg of calcium and at least 600 mg of Vitamin D per day.  If you are older than age 70, take 1200 mg of calcium and at least 800 mg of Vitamin D per day. Smoking and excessive alcohol intake increases the risk of osteoporosis. Eat foods rich in calcium and vitamin D and do weight bearing exercises several times a week as your caregiver suggests. DIABETES Diabetes Mellitus: If you have type I or type 2 diabetes, you should keep your blood sugar under control with diet, exercise, and recommended medication. Avoid starchy and fatty foods, and too many sweets. Being overweight can make diabetes control more difficult. COGNITION AND MEMORY Cognition and Memory: Menopausal hormone therapy is not recommended for the prevention of cognitive disorders such as Alzheimer's disease or memory loss.  DEPRESSION  Depression may occur at any age, but it is common in elderly women. This may be because of physical, medical, social (loneliness), or financial problems and needs. If you are experiencing depression because of medical problems and control of symptoms, talk to your caregiver about this. Physical   activity and exercise may help with mood and sleep. Community and volunteer involvement may improve your sense of value and worth. If you have depression and you feel that the problem is getting worse or becoming severe, talk to your caregiver about which treatment options are best for you. ACCIDENTS  Accidents are common and can be serious in elderly woman. Prepare your house to prevent accidents. Eliminate throw rugs, place hand bars in bath, shower, and toilet areas. Avoid wearing high heeled shoes or walking on wet, snowy, and icy areas. Limit or stop driving if you have vision or hearing problems, or if you feel you are unsteady with your movements and  reflexes. HEPATITIS C Hepatitis C is a type of viral infection affecting the liver. It is spread mainly through contact with blood from an infected person. It can be treated, but if left untreated, it can lead to severe liver damage over the years. Many people who are infected do not know that the virus is in their blood. If you are a "baby-boomer", it is recommended that you have one screening test for Hepatitis C. IMMUNIZATIONS  Several immunizations are important to consider having during your senior years, including:   Tetanus, diphtheria, and pertussis booster shot.  Influenza every year before the flu season begins.  Pneumonia vaccine.  Shingles vaccine.  Others, as indicated based on your specific needs. Talk to your caregiver about these. Document Released: 05/09/2005 Document Revised: 08/01/2013 Document Reviewed: 01/03/2008 [employer] Patient Information 2015 [employer]. This information is not intended to replace advice given to you by your health care provider. Make sure you discuss any questions you have with your health care provider.  

## 2014-11-06 NOTE — Progress Notes (Signed)
Jennifer Hammond Aug 12, 1955 [id number]    History:    Presents for annual exam.  1991 TAH with BSO for endometriosis on Estring. Had a hip replacement/osteoarthritis in May,  no estrogen prior to surgery, started back 1 month after for dryness and hot flushes. Normal Pap and mammogram history. 2012 melanoma has skin checks every 6 months. History of osteoporosis had been on Fosamax for 5 years primary care manages. 2015 negative colonoscopy.  Past medical history, past surgical history, family history and social history were all reviewed and documented in the EPIC chart. Works at [employer], residence  living. Has 2 adopted children ages 56 and 7 both doing well.  ROS:  A ROS was performed and pertinent positives and negatives are included.  Exam:  Filed Vitals:   11/06/14 0901  BP: 120/78    General appearance:  Normal Thyroid:  Symmetrical, normal in size, without palpable masses or nodularity. Respiratory  Auscultation:  Clear without wheezing or rhonchi Cardiovascular  Auscultation:  Regular rate, without rubs, murmurs or gallops  Edema/varicosities:  Not grossly evident Abdominal  Soft,nontender, without masses, guarding or rebound.  Liver/spleen:  No organomegaly noted  Hernia:  None appreciated  Skin  Inspection:  Grossly normal   Breasts: Examined lying and sitting.     Right: Without masses, retractions, discharge or axillary adenopathy.     Left: Without masses, retractions, discharge or axillary adenopathy. Gentitourinary   Inguinal/mons:  Normal without inguinal adenopathy  External genitalia:  Normal  BUS/Urethra/Skene's glands:  Normal  Vagina:  Normal  Cervix:  Uterus absent  Adnexa/parametria:     Rt: Without masses or tenderness.   Lt: Without masses or tenderness.  Anus and perineum: Normal  Digital rectal exam: Normal sphincter tone without palpated masses or tenderness  Assessment/Plan:  59 y.o. MWF G0 for annual exam with no complaints.  1991 TAH with BSO  for endometriosis on Estring May 2016 hip replacement-orthopedist managing 2012 melanoma skin checks every 6 months Osteoporosis-primary care managing  Plan: Estring every 12 weeks prescription, proper use given and reviewed. Reviewed systemic absorption risks of blood clots, strokes and breast cancer. SBE's, continue annual 3-D mammogram history of dense breasts. Exercise as able, calcium rich diet, vitamin D 2000. Home safety fall prevention discussed. UA. Zostavax at 60 encouraged.    Huel Cote [facility], 5:56 PM 11/06/2014

## 2014-11-07 LAB — URINALYSIS W MICROSCOPIC + REFLEX CULTURE
BACTERIA UA: NONE SEEN [HPF]
Bilirubin Urine: NEGATIVE
CRYSTALS: NONE SEEN [HPF]
Casts: NONE SEEN [LPF]
Glucose, UA: NEGATIVE
HGB URINE DIPSTICK: NEGATIVE
Ketones, ur: NEGATIVE
LEUKOCYTES UA: NEGATIVE
NITRITE: NEGATIVE
PROTEIN: NEGATIVE
RBC / HPF: NONE SEEN RBC/HPF (ref <=2)
Specific Gravity, Urine: 1.013 (ref 1.001–1.035)
Squamous Epithelial / LPF: NONE SEEN [HPF] (ref <=5)
WBC, UA: NONE SEEN WBC/HPF (ref <=5)
YEAST: NONE SEEN [HPF]
pH: 7.5 (ref 5.0–8.0)

## 2015-07-12 DIAGNOSIS — Z0000 Encounter for general adult medical examination without abnormal findings: Secondary | ICD-10-CM | POA: Diagnosis not present

## 2015-07-12 DIAGNOSIS — M818 Other osteoporosis without current pathological fracture: Secondary | ICD-10-CM | POA: Diagnosis not present

## 2015-07-20 DIAGNOSIS — E782 Mixed hyperlipidemia: Secondary | ICD-10-CM | POA: Diagnosis not present

## 2015-07-20 DIAGNOSIS — M818 Other osteoporosis without current pathological fracture: Secondary | ICD-10-CM | POA: Diagnosis not present

## 2015-07-20 DIAGNOSIS — Z0000 Encounter for general adult medical examination without abnormal findings: Secondary | ICD-10-CM | POA: Diagnosis not present

## 2015-08-20 DIAGNOSIS — Z1231 Encounter for screening mammogram for malignant neoplasm of breast: Secondary | ICD-10-CM | POA: Diagnosis not present

## 2015-08-23 ENCOUNTER — Encounter: Payer: Self-pay | Admitting: Women's Health

## 2015-08-28 DIAGNOSIS — L820 Inflamed seborrheic keratosis: Secondary | ICD-10-CM | POA: Diagnosis not present

## 2015-08-28 DIAGNOSIS — L570 Actinic keratosis: Secondary | ICD-10-CM | POA: Diagnosis not present

## 2015-08-28 DIAGNOSIS — L814 Other melanin hyperpigmentation: Secondary | ICD-10-CM | POA: Diagnosis not present

## 2015-08-28 DIAGNOSIS — D229 Melanocytic nevi, unspecified: Secondary | ICD-10-CM | POA: Diagnosis not present

## 2015-10-24 DIAGNOSIS — Z09 Encounter for follow-up examination after completed treatment for conditions other than malignant neoplasm: Secondary | ICD-10-CM | POA: Diagnosis not present

## 2015-10-24 DIAGNOSIS — Z96642 Presence of left artificial hip joint: Secondary | ICD-10-CM | POA: Diagnosis not present

## 2015-11-07 ENCOUNTER — Encounter: Payer: BLUE CROSS/BLUE SHIELD | Admitting: Women's Health

## 2015-11-13 ENCOUNTER — Ambulatory Visit (INDEPENDENT_AMBULATORY_CARE_PROVIDER_SITE_OTHER): Payer: BLUE CROSS/BLUE SHIELD | Admitting: Women's Health

## 2015-11-13 ENCOUNTER — Encounter: Payer: Self-pay | Admitting: Women's Health

## 2015-11-13 VITALS — BP 120/79 | Ht 64.0 in | Wt 125.7 lb

## 2015-11-13 DIAGNOSIS — Z01419 Encounter for gynecological examination (general) (routine) without abnormal findings: Secondary | ICD-10-CM | POA: Diagnosis not present

## 2015-11-13 DIAGNOSIS — Z79890 Hormone replacement therapy (postmenopausal): Secondary | ICD-10-CM | POA: Diagnosis not present

## 2015-11-13 MED ORDER — ESTRADIOL 2 MG VA RING: 2.0000 mg | VAGINAL_RING | VAGINAL | 4 refills | Status: DC

## 2015-11-13 NOTE — Patient Instructions (Signed)
Osteoporosis Osteoporosis is the thinning and loss of density in the bones. Osteoporosis makes the bones more brittle, fragile, and likely to break (fracture). Over time, osteoporosis can cause the bones to become so weak that they fracture after a simple fall. The bones most likely to fracture are the bones in the hip, wrist, and spine. CAUSES  The exact cause is not known. RISK FACTORS Anyone can develop osteoporosis. You may be at greater risk if you have a family history of the condition or have poor nutrition. You may also have a higher risk if you are:   Female.   50 years old or older.  A smoker.  Not physically active.   White or Asian.  Slender. SIGNS AND SYMPTOMS  A fracture might be the first sign of the disease, especially if it results from a fall or injury that would not usually cause a bone to break. Other signs and symptoms include:   Low back and neck pain.  Stooped posture.  Height loss. DIAGNOSIS  To make a diagnosis, your health care provider may:  Take a medical history.  Perform a physical exam.  Order tests, such as:  A bone mineral density test.  A dual-energy X-ray absorptiometry test. TREATMENT  The goal of osteoporosis treatment is to strengthen your bones to reduce your risk of a fracture. Treatment may involve:  Making lifestyle changes, such as:  Eating a diet rich in calcium.  Doing weight-bearing and muscle-strengthening exercises.  Stopping tobacco use.  Limiting alcohol intake.  Taking medicine to slow the process of bone loss or to increase bone density.  Monitoring your levels of calcium and vitamin D. HOME CARE INSTRUCTIONS  Include calcium and vitamin D in your diet. Calcium is important for bone health, and vitamin D helps the body absorb calcium.  Perform weight-bearing and muscle-strengthening exercises as directed by your health care provider.  Do not use any tobacco products, including cigarettes, chewing  tobacco, and electronic cigarettes. If you need help quitting, ask your health care provider.  Limit your alcohol intake.  Take medicines only as directed by your health care provider.  Keep all follow-up visits as directed by your health care provider. This is important.  Take precautions at home to lower your risk of falling, such as:  Keeping rooms well lit and clutter free.  Installing safety rails on stairs.  Using rubber mats in the bathroom and other areas that are often wet or slippery. SEEK IMMEDIATE MEDICAL CARE IF:  You fall or injure yourself.    This information is not intended to replace advice given to you by your health care provider. Make sure you discuss any questions you have with your health care provider.   Document Released: 12/25/2004 Document Revised: 04/07/2014 Document Reviewed: 08/25/2013 Elsevier Interactive Patient Education 2016 [employer] Menopause is a normal process in which your reproductive ability comes to an end. This process happens gradually over a span of months to years, usually between the ages of 48 and 55. Menopause is complete when you have missed 12 consecutive menstrual periods. It is important to talk with your health care provider about some of the most common conditions that affect postmenopausal women, such as heart disease, cancer, and bone loss (osteoporosis). Adopting a healthy lifestyle and getting preventive care can help to promote your health and wellness. Those actions can also lower your chances of developing some of these common conditions. WHAT SHOULD I KNOW ABOUT MENOPAUSE? During menopause, you may experience a   number of symptoms, such as:  Moderate-to-severe hot flashes.  Night sweats.  Decrease in sex drive.  Mood swings.  Headaches.  Tiredness.  Irritability.  Memory problems.  Insomnia. Choosing to treat or not to treat menopausal changes is an individual decision that you make with your health care  provider. WHAT SHOULD I KNOW ABOUT HORMONE REPLACEMENT THERAPY AND SUPPLEMENTS? Hormone therapy products are effective for treating symptoms that are associated with menopause, such as hot flashes and night sweats. Hormone replacement carries certain risks, especially as you become older. If you are thinking about using estrogen or estrogen with progestin treatments, discuss the benefits and risks with your health care provider. WHAT SHOULD I KNOW ABOUT HEART DISEASE AND STROKE? Heart disease, heart attack, and stroke become more likely as you age. This may be due, in part, to the hormonal changes that your body experiences during menopause. These can affect how your body processes dietary fats, triglycerides, and cholesterol. Heart attack and stroke are both medical emergencies. There are many things that you can do to help prevent heart disease and stroke:  Have your blood pressure checked at least every 1-2 years. High blood pressure causes heart disease and increases the risk of stroke.  If you are 55-79 years old, ask your health care provider if you should take aspirin to prevent a heart attack or a stroke.  Do not use any tobacco products, including cigarettes, chewing tobacco, or electronic cigarettes. If you need help quitting, ask your health care provider.  It is important to eat a healthy diet and maintain a healthy weight.  Be sure to include plenty of vegetables, fruits, low-fat dairy products, and lean protein.  Avoid eating foods that are high in solid fats, added sugars, or salt (sodium).  Get regular exercise. This is one of the most important things that you can do for your health.  Try to exercise for at least 150 minutes each week. The type of exercise that you do should increase your heart rate and make you sweat. This is known as moderate-intensity exercise.  Try to do strengthening exercises at least twice each week. Do these in addition to the moderate-intensity  exercise.  Know your numbers.Ask your health care provider to check your cholesterol and your blood glucose. Continue to have your blood tested as directed by your health care provider. WHAT SHOULD I KNOW ABOUT CANCER SCREENING? There are several types of cancer. Take the following steps to reduce your risk and to catch any cancer development as early as possible. Breast Cancer  Practice breast self-awareness.  This means understanding how your breasts normally appear and feel.  It also means doing regular breast self-exams. Let your health care provider know about any changes, no matter how small.  If you are 40 or older, have a clinician do a breast exam (clinical breast exam or CBE) every year. Depending on your age, family history, and medical history, it may be recommended that you also have a yearly breast X-ray (mammogram).  If you have a family history of breast cancer, talk with your health care provider about genetic screening.  If you are at high risk for breast cancer, talk with your health care provider about having an MRI and a mammogram every year.  Breast cancer (BRCA) gene test is recommended for women who have family members with BRCA-related cancers. Results of the assessment will determine the need for genetic counseling and BRCA1 and for BRCA2 testing. BRCA-related cancers include these types:    Breast. This occurs in males or females.  Ovarian.  Tubal. This may also be called fallopian tube cancer.  Cancer of the abdominal or pelvic lining (peritoneal cancer).  Prostate.  Pancreatic. Cervical, Uterine, and Ovarian Cancer Your health care provider may recommend that you be screened regularly for cancer of the pelvic organs. These include your ovaries, uterus, and vagina. This screening involves a pelvic exam, which includes checking for microscopic changes to the surface of your cervix (Pap test).  For women ages 21-65, health care providers may recommend a  pelvic exam and a Pap test every three years. For women ages 76-65, they may recommend the Pap test and pelvic exam, combined with testing for human papilloma virus (HPV), every five years. Some types of HPV increase your risk of cervical cancer. Testing for HPV may also be done on women of any age who have unclear Pap test results.  Other health care providers may not recommend any screening for nonpregnant women who are considered low risk for pelvic cancer and have no symptoms. Ask your health care provider if a screening pelvic exam is right for you.  If you have had past treatment for cervical cancer or a condition that could lead to cancer, you need Pap tests and screening for cancer for at least 20 years after your treatment. If Pap tests have been discontinued for you, your risk factors (such as having a new sexual partner) need to be reassessed to determine if you should start having screenings again. Some women have medical problems that increase the chance of getting cervical cancer. In these cases, your health care provider may recommend that you have screening and Pap tests more often.  If you have a family history of uterine cancer or ovarian cancer, talk with your health care provider about genetic screening.  If you have vaginal bleeding after reaching menopause, tell your health care provider.  There are currently no reliable tests available to screen for ovarian cancer. Lung Cancer Lung cancer screening is recommended for adults 61-18 years old who are at high risk for lung cancer because of a history of smoking. A yearly low-dose CT scan of the lungs is recommended if you:  Currently smoke.  Have a history of at least 30 pack-years of smoking and you currently smoke or have quit within the past 15 years. A pack-year is smoking an average of one pack of cigarettes per day for one year. Yearly screening should:  Continue until it has been 15 years since you quit.  Stop if you  develop a health problem that would prevent you from having lung cancer treatment. Colorectal Cancer  This type of cancer can be detected and can often be prevented.  Routine colorectal cancer screening usually begins at age 28 and continues through age 29.  If you have risk factors for colon cancer, your health care provider may recommend that you be screened at an earlier age.  If you have a family history of colorectal cancer, talk with your health care provider about genetic screening.  Your health care provider may also recommend using home test kits to check for hidden blood in your stool.  A small camera at the end of a tube can be used to examine your colon directly (sigmoidoscopy or colonoscopy). This is done to check for the earliest forms of colorectal cancer.  Direct examination of the colon should be repeated every 5-10 years until age 9. However, if early forms of precancerous polyps or small  growths are found or if you have a family history or genetic risk for colorectal cancer, you may need to be screened more often. Skin Cancer  Check your skin from head to toe regularly.  Monitor any moles. Be sure to tell your health care provider:  About any new moles or changes in moles, especially if there is a change in a mole's shape or color.  If you have a mole that is larger than the size of a pencil eraser.  If any of your family members has a history of skin cancer, especially at a Willodene Stallings age, talk with your health care provider about genetic screening.  Always use sunscreen. Apply sunscreen liberally and repeatedly throughout the day.  Whenever you are outside, protect yourself by wearing long sleeves, pants, a wide-brimmed hat, and sunglasses. WHAT SHOULD I KNOW ABOUT OSTEOPOROSIS? Osteoporosis is a condition in which bone destruction happens more quickly than new bone creation. After menopause, you may be at an increased risk for osteoporosis. To help prevent  osteoporosis or the bone fractures that can happen because of osteoporosis, the following is recommended:  If you are 21-6 years old, get at least 1,000 mg of calcium and at least 600 mg of vitamin D per day.  If you are older than age 32 but younger than age 23, get at least 1,200 mg of calcium and at least 600 mg of vitamin D per day.  If you are older than age 75, get at least 1,200 mg of calcium and at least 800 mg of vitamin D per day. Smoking and excessive alcohol intake increase the risk of osteoporosis. Eat foods that are rich in calcium and vitamin D, and do weight-bearing exercises several times each week as directed by your health care provider. WHAT SHOULD I KNOW ABOUT HOW MENOPAUSE AFFECTS [place]? Depression may occur at any age, but it is more common as you become older. Common symptoms of depression include:  Low or sad mood.  Changes in sleep patterns.  Changes in appetite or eating patterns.  Feeling an overall lack of motivation or enjoyment of activities that you previously enjoyed.  Frequent crying spells. Talk with your health care provider if you think that you are experiencing depression. WHAT SHOULD I KNOW ABOUT IMMUNIZATIONS? It is important that you get and maintain your immunizations. These include:  Tetanus, diphtheria, and pertussis (Tdap) booster vaccine.  Influenza every year before the flu season begins.  Pneumonia vaccine.  Shingles vaccine. Your health care provider may also recommend other immunizations.   This information is not intended to replace advice given to you by your health care provider. Make sure you discuss any questions you have with your health care provider.   Document Released: 05/09/2005 Document Revised: 04/07/2014 Document Reviewed: 11/17/2013 Elsevier Interactive Patient Education [employer].

## 2015-11-13 NOTE — Progress Notes (Signed)
Jennifer Hammond 1955/10/10 [id number]    History:    Presents for annual exam.  1991 TAH with BSO for endometriosis on Estring. Normal Pap and mammogram history. 2012 melanoma has annual skin checks. 2015 negative colonoscopy. Osteoporosis in the past has been on Fosamax for 5 years primary care manages DEXA. Has not had Zostavax. Not sexually active husbands health.  Past medical history, past surgical history, family history and social history were all reviewed and documented in the EPIC chart. Works at [employer] in [place]. 2 adopted children ages 57, 42 both doing well.  ROS:  A ROS was performed and pertinent positives and negatives are included.  Exam:  Vitals:   11/13/15 1610  BP: 120/79  Weight: 125 lb 11.2 oz (57 kg)  Height: 5\' 4"  (1.626 m)   Body mass index is 21.58 kg/m.   General appearance:  Normal Thyroid:  Symmetrical, normal in size, without palpable masses or nodularity. Respiratory  Auscultation:  Clear without wheezing or rhonchi Cardiovascular  Auscultation:  Regular rate, without rubs, murmurs or gallops  Edema/varicosities:  Not grossly evident Abdominal  Soft,nontender, without masses, guarding or rebound.  Liver/spleen:  No organomegaly noted  Hernia:  None appreciated  Skin  Inspection:  Grossly normal   Breasts: Examined lying and sitting.     Right: Without masses, retractions, discharge or axillary adenopathy.     Left: Without masses, retractions, discharge or axillary adenopathy. Gentitourinary   Inguinal/mons:  Normal without inguinal adenopathy  External genitalia:  Normal  BUS/Urethra/Skene's glands:  Normal  Vagina:  Atrophic Cervix:  And uterus absent Adnexa/parametria:     Rt: Without masses or tenderness.   Lt: Without masses or tenderness.  Anus and perineum: Normal  Digital rectal exam: Normal sphincter tone without palpated masses or tenderness  Assessment/Plan:  60 y.o. MWF G0 +2 adopted for annual exam no complaints.  1991  TAH with BSO for endometriosis on Estring 07/2014 hip replacement - hip necrosis Osteoporosis Fosamax in the past primary care manages 2012 melanoma-dermatologist manages [facility]-primary care  Plan: Estring 2 mg per vagina every 12 weeks prescription, proper use, slight risk for systemic absorption, has had good relief of vaginal atrophy. SBE's, continue annual 3-D screening mammogram history of dense breast. Exercise, calcium rich diet, vitamin D 2000 daily encouraged. Instructed to have vitamin D level checked at next blood draw. Yoga, balance type exercise reviewed. Zostavax recommended.  [place], 4:56 PM 11/13/2015

## 2016-02-19 DIAGNOSIS — Z85820 Personal history of malignant melanoma of skin: Secondary | ICD-10-CM | POA: Diagnosis not present

## 2016-02-19 DIAGNOSIS — L814 Other melanin hyperpigmentation: Secondary | ICD-10-CM | POA: Diagnosis not present

## 2016-02-19 DIAGNOSIS — L570 Actinic keratosis: Secondary | ICD-10-CM | POA: Diagnosis not present

## 2016-02-19 DIAGNOSIS — D229 Melanocytic nevi, unspecified: Secondary | ICD-10-CM | POA: Diagnosis not present

## 2016-07-21 DIAGNOSIS — M818 Other osteoporosis without current pathological fracture: Secondary | ICD-10-CM | POA: Diagnosis not present

## 2016-07-21 DIAGNOSIS — Z79899 Other long term (current) drug therapy: Secondary | ICD-10-CM | POA: Diagnosis not present

## 2016-07-21 DIAGNOSIS — Z0000 Encounter for general adult medical examination without abnormal findings: Secondary | ICD-10-CM | POA: Diagnosis not present

## 2016-07-21 DIAGNOSIS — R7989 Other specified abnormal findings of blood chemistry: Secondary | ICD-10-CM | POA: Diagnosis not present

## 2016-07-21 DIAGNOSIS — E782 Mixed hyperlipidemia: Secondary | ICD-10-CM | POA: Diagnosis not present

## 2016-07-29 DIAGNOSIS — M8588 Other specified disorders of bone density and structure, other site: Secondary | ICD-10-CM | POA: Diagnosis not present

## 2016-08-05 DIAGNOSIS — Z85820 Personal history of malignant melanoma of skin: Secondary | ICD-10-CM | POA: Diagnosis not present

## 2016-08-13 ENCOUNTER — Encounter: Payer: Self-pay | Admitting: Gynecology

## 2016-09-03 DIAGNOSIS — R21 Rash and other nonspecific skin eruption: Secondary | ICD-10-CM | POA: Diagnosis not present

## 2016-09-09 ENCOUNTER — Encounter: Payer: Self-pay | Admitting: Women's Health

## 2016-09-09 DIAGNOSIS — Z1231 Encounter for screening mammogram for malignant neoplasm of breast: Secondary | ICD-10-CM | POA: Diagnosis not present

## 2016-11-13 ENCOUNTER — Encounter: Payer: BLUE CROSS/BLUE SHIELD | Admitting: Women's Health

## 2016-11-19 ENCOUNTER — Encounter: Payer: Self-pay | Admitting: Women's Health

## 2016-11-19 ENCOUNTER — Ambulatory Visit (INDEPENDENT_AMBULATORY_CARE_PROVIDER_SITE_OTHER): Payer: BLUE CROSS/BLUE SHIELD | Admitting: Women's Health

## 2016-11-19 VITALS — BP 124/78 | Ht 64.0 in | Wt 123.0 lb

## 2016-11-19 DIAGNOSIS — Z79890 Hormone replacement therapy (postmenopausal): Secondary | ICD-10-CM

## 2016-11-19 DIAGNOSIS — Z01419 Encounter for gynecological examination (general) (routine) without abnormal findings: Secondary | ICD-10-CM

## 2016-11-19 MED ORDER — ESTRADIOL 2 MG VA RING: 2.0000 mg | VAGINAL_RING | VAGINAL | 4 refills | Status: DC

## 2016-11-19 NOTE — Patient Instructions (Signed)
Health Maintenance for Postmenopausal Women Menopause is a normal process in which your reproductive ability comes to an end. This process happens gradually over a span of months to years, usually between the ages of 22 and 9. Menopause is complete when you have missed 12 consecutive menstrual periods. It is important to talk with your health care provider about some of the most common conditions that affect postmenopausal women, such as heart disease, cancer, and bone loss (osteoporosis). Adopting a healthy lifestyle and getting preventive care can help to promote your health and wellness. Those actions can also lower your chances of developing some of these common conditions. What should I know about menopause? During menopause, you may experience a number of symptoms, such as:  Moderate-to-severe hot flashes.  Night sweats.  Decrease in sex drive.  Mood swings.  Headaches.  Tiredness.  Irritability.  Memory problems.  Insomnia.  Choosing to treat or not to treat menopausal changes is an individual decision that you make with your health care provider. What should I know about hormone replacement therapy and supplements? Hormone therapy products are effective for treating symptoms that are associated with menopause, such as hot flashes and night sweats. Hormone replacement carries certain risks, especially as you become older. If you are thinking about using estrogen or estrogen with progestin treatments, discuss the benefits and risks with your health care provider. What should I know about heart disease and stroke? Heart disease, heart attack, and stroke become more likely as you age. This may be due, in part, to the hormonal changes that your body experiences during menopause. These can affect how your body processes dietary fats, triglycerides, and cholesterol. Heart attack and stroke are both medical emergencies. There are many things that you can do to help prevent heart disease  and stroke:  Have your blood pressure checked at least every 1-2 years. High blood pressure causes heart disease and increases the risk of stroke.  If you are 53-22 years old, ask your health care provider if you should take aspirin to prevent a heart attack or a stroke.  Do not use any tobacco products, including cigarettes, chewing tobacco, or electronic cigarettes. If you need help quitting, ask your health care provider.  It is important to eat a healthy diet and maintain a healthy weight. ? Be sure to include plenty of vegetables, fruits, low-fat dairy products, and lean protein. ? Avoid eating foods that are high in solid fats, added sugars, or salt (sodium).  Get regular exercise. This is one of the most important things that you can do for your health. ? Try to exercise for at least 150 minutes each week. The type of exercise that you do should increase your heart rate and make you sweat. This is known as moderate-intensity exercise. ? Try to do strengthening exercises at least twice each week. Do these in addition to the moderate-intensity exercise.  Know your numbers.Ask your health care provider to check your cholesterol and your blood glucose. Continue to have your blood tested as directed by your health care provider.  What should I know about cancer screening? There are several types of cancer. Take the following steps to reduce your risk and to catch any cancer development as early as possible. Breast Cancer  Practice breast self-awareness. ? This means understanding how your breasts normally appear and feel. ? It also means doing regular breast self-exams. Let your health care provider know about any changes, no matter how small.  If you are 40  or older, have a clinician do a breast exam (clinical breast exam or CBE) every year. Depending on your age, family history, and medical history, it may be recommended that you also have a yearly breast X-ray (mammogram).  If you  have a family history of breast cancer, talk with your health care provider about genetic screening.  If you are at high risk for breast cancer, talk with your health care provider about having an MRI and a mammogram every year.  Breast cancer (BRCA) gene test is recommended for women who have family members with BRCA-related cancers. Results of the assessment will determine the need for genetic counseling and BRCA1 and for BRCA2 testing. BRCA-related cancers include these types: ? Breast. This occurs in males or females. ? Ovarian. ? Tubal. This may also be called fallopian tube cancer. ? Cancer of the abdominal or pelvic lining (peritoneal cancer). ? Prostate. ? Pancreatic.  Cervical, Uterine, and Ovarian Cancer Your health care provider may recommend that you be screened regularly for cancer of the pelvic organs. These include your ovaries, uterus, and vagina. This screening involves a pelvic exam, which includes checking for microscopic changes to the surface of your cervix (Pap test).  For women ages 21-65, health care providers may recommend a pelvic exam and a Pap test every three years. For women ages 79-65, they may recommend the Pap test and pelvic exam, combined with testing for human papilloma virus (HPV), every five years. Some types of HPV increase your risk of cervical cancer. Testing for HPV may also be done on women of any age who have unclear Pap test results.  Other health care providers may not recommend any screening for nonpregnant women who are considered low risk for pelvic cancer and have no symptoms. Ask your health care provider if a screening pelvic exam is right for you.  If you have had past treatment for cervical cancer or a condition that could lead to cancer, you need Pap tests and screening for cancer for at least 20 years after your treatment. If Pap tests have been discontinued for you, your risk factors (such as having a new sexual partner) need to be  reassessed to determine if you should start having screenings again. Some women have medical problems that increase the chance of getting cervical cancer. In these cases, your health care provider may recommend that you have screening and Pap tests more often.  If you have a family history of uterine cancer or ovarian cancer, talk with your health care provider about genetic screening.  If you have vaginal bleeding after reaching menopause, tell your health care provider.  There are currently no reliable tests available to screen for ovarian cancer.  Lung Cancer Lung cancer screening is recommended for adults 69-62 years old who are at high risk for lung cancer because of a history of smoking. A yearly low-dose CT scan of the lungs is recommended if you:  Currently smoke.  Have a history of at least 30 pack-years of smoking and you currently smoke or have quit within the past 15 years. A pack-year is smoking an average of one pack of cigarettes per day for one year.  Yearly screening should:  Continue until it has been 15 years since you quit.  Stop if you develop a health problem that would prevent you from having lung cancer treatment.  Colorectal Cancer  This type of cancer can be detected and can often be prevented.  Routine colorectal cancer screening usually begins at  age 42 and continues through age 45.  If you have risk factors for colon cancer, your health care provider may recommend that you be screened at an earlier age.  If you have a family history of colorectal cancer, talk with your health care provider about genetic screening.  Your health care provider may also recommend using home test kits to check for hidden blood in your stool.  A small camera at the end of a tube can be used to examine your colon directly (sigmoidoscopy or colonoscopy). This is done to check for the earliest forms of colorectal cancer.  Direct examination of the colon should be repeated every  5-10 years until age 71. However, if early forms of precancerous polyps or small growths are found or if you have a family history or genetic risk for colorectal cancer, you may need to be screened more often.  Skin Cancer  Check your skin from head to toe regularly.  Monitor any moles. Be sure to tell your health care provider: ? About any new moles or changes in moles, especially if there is a change in a mole's shape or color. ? If you have a mole that is larger than the size of a pencil eraser.  If any of your family members has a history of skin cancer, especially at a young age, talk with your health care provider about genetic screening.  Always use sunscreen. Apply sunscreen liberally and repeatedly throughout the day.  Whenever you are outside, protect yourself by wearing long sleeves, pants, a wide-brimmed hat, and sunglasses.  What should I know about osteoporosis? Osteoporosis is a condition in which bone destruction happens more quickly than new bone creation. After menopause, you may be at an increased risk for osteoporosis. To help prevent osteoporosis or the bone fractures that can happen because of osteoporosis, the following is recommended:  If you are 46-71 years old, get at least 1,000 mg of calcium and at least 600 mg of vitamin D per day.  If you are older than age 55 but younger than age 65, get at least 1,200 mg of calcium and at least 600 mg of vitamin D per day.  If you are older than age 54, get at least 1,200 mg of calcium and at least 800 mg of vitamin D per day.  Smoking and excessive alcohol intake increase the risk of osteoporosis. Eat foods that are rich in calcium and vitamin D, and do weight-bearing exercises several times each week as directed by your health care provider. What should I know about how menopause affects my mental health? Depression may occur at any age, but it is more common as you become older. Common symptoms of depression  include:  Low or sad mood.  Changes in sleep patterns.  Changes in appetite or eating patterns.  Feeling an overall lack of motivation or enjoyment of activities that you previously enjoyed.  Frequent crying spells.  Talk with your health care provider if you think that you are experiencing depression. What should I know about immunizations? It is important that you get and maintain your immunizations. These include:  Tetanus, diphtheria, and pertussis (Tdap) booster vaccine.  Influenza every year before the flu season begins.  Pneumonia vaccine.  Shingles vaccine.  Your health care provider may also recommend other immunizations. This information is not intended to replace advice given to you by your health care provider. Make sure you discuss any questions you have with your health care provider. Document Released: 05/09/2005  Document Revised: 10/05/2015 Document Reviewed: 12/19/2014 Elsevier Interactive Patient Education  2018 [employer]vier Inc.  

## 2016-11-19 NOTE — Progress Notes (Signed)
Jennifer Hammond [birth year] [id number]    History:    Presents for annual exam.  Postmenopausal on Estring, rare intercourse husbands health. 1991 TAH with BSO for endometriosis. Normal Pap and mammogram history. 2012 melanoma has biannual exams. 2015 negative colonoscopy. Depression and osteoporosis primary care manages. History of 5 years on Fosamax. History of osteoarthritis hip replacement in the past.   Past medical history, past surgical history, family history and social history were all reviewed and documented in the EPIC chart. 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 Adopted children are ages 23 and 60 both doing well.  ROS:  A ROS was performed and pertinent positives and negatives are included.  Exam:  Vitals:   11/19/16 1625  BP: 124/78  Weight: 123 lb (55.8 kg)  Height: 5\' 4"  (1.626 m)   Body mass index is 21.11 kg/m.   General appearance:  Normal Thyroid:  Symmetrical, normal in size, without palpable masses or nodularity. Respiratory  Auscultation:  Clear without wheezing or rhonchi Cardiovascular  Auscultation:  Regular rate, without rubs, murmurs or gallops  Edema/varicosities:  Not grossly evident Abdominal  Soft,nontender, without masses, guarding or rebound.  Liver/spleen:  No organomegaly noted  Hernia:  None appreciated  Skin  Inspection:  Grossly normal   Breasts: Examined lying and sitting.     Right: Without masses, retractions, discharge or axillary adenopathy.     Left: Without masses, retractions, discharge or axillary adenopathy. Gentitourinary   Inguinal/mons:  Normal without inguinal adenopathy  External genitalia:  Normal  BUS/Urethra/Skene's glands:  Normal  Vagina:  Normal  Cervix:  And uterus absent  Adnexa/parametria:     Rt: Without masses or tenderness.   Lt: Without masses or tenderness.  Anus and perineum: Normal  Digital rectal exam: Normal sphincter tone without palpated masses or tenderness  Assessment/Plan:  61 y.o. MWF G0  +2 adopted  for annual exam with no complaints.  1991 TAH with BSO for endometriosis on Estring Osteoporosis/depression-primary care manages labs and meds 2012 melanoma-biannual skin checks  Plan: Estring 2 mg per vagina every 3 months,  prescription, proper use given and reviewed. SBE's, continue annual screening mammogram, calcium rich diet, vitamin D 2000 daily encouraged. Home safety, fall prevention and importance of weightbearing exercise, yoga encouraged.     [place], 4:54 PM 11/19/2016

## 2016-11-27 DIAGNOSIS — Z09 Encounter for follow-up examination after completed treatment for conditions other than malignant neoplasm: Secondary | ICD-10-CM | POA: Diagnosis not present

## 2016-11-27 DIAGNOSIS — Z96642 Presence of left artificial hip joint: Secondary | ICD-10-CM | POA: Diagnosis not present

## 2016-12-19 DIAGNOSIS — M25562 Pain in left knee: Secondary | ICD-10-CM | POA: Diagnosis not present

## 2016-12-19 DIAGNOSIS — W19XXXA Unspecified fall, initial encounter: Secondary | ICD-10-CM | POA: Diagnosis not present

## 2016-12-19 DIAGNOSIS — S82032A Displaced transverse fracture of left patella, initial encounter for closed fracture: Secondary | ICD-10-CM | POA: Diagnosis not present

## 2016-12-19 DIAGNOSIS — S82042A Displaced comminuted fracture of left patella, initial encounter for closed fracture: Secondary | ICD-10-CM | POA: Diagnosis not present

## 2016-12-20 DIAGNOSIS — S82032A Displaced transverse fracture of left patella, initial encounter for closed fracture: Secondary | ICD-10-CM | POA: Diagnosis not present

## 2016-12-23 DIAGNOSIS — S82032A Displaced transverse fracture of left patella, initial encounter for closed fracture: Secondary | ICD-10-CM | POA: Diagnosis not present

## 2016-12-26 DIAGNOSIS — W1830XA Fall on same level, unspecified, initial encounter: Secondary | ICD-10-CM | POA: Diagnosis not present

## 2016-12-26 DIAGNOSIS — S82032A Displaced transverse fracture of left patella, initial encounter for closed fracture: Secondary | ICD-10-CM | POA: Diagnosis not present

## 2017-01-06 DIAGNOSIS — S82032D Displaced transverse fracture of left patella, subsequent encounter for closed fracture with routine healing: Secondary | ICD-10-CM | POA: Diagnosis not present

## 2017-01-06 DIAGNOSIS — M25562 Pain in left knee: Secondary | ICD-10-CM | POA: Diagnosis not present

## 2017-01-23 ENCOUNTER — Ambulatory Visit: Payer: BLUE CROSS/BLUE SHIELD | Admitting: Adult Health

## 2017-01-23 DIAGNOSIS — Z23 Encounter for immunization: Secondary | ICD-10-CM

## 2017-02-05 DIAGNOSIS — S82032D Displaced transverse fracture of left patella, subsequent encounter for closed fracture with routine healing: Secondary | ICD-10-CM | POA: Diagnosis not present

## 2017-02-10 DIAGNOSIS — M25562 Pain in left knee: Secondary | ICD-10-CM | POA: Diagnosis not present

## 2017-02-12 DIAGNOSIS — M25562 Pain in left knee: Secondary | ICD-10-CM | POA: Diagnosis not present

## 2017-02-16 DIAGNOSIS — M25562 Pain in left knee: Secondary | ICD-10-CM | POA: Diagnosis not present

## 2017-02-18 DIAGNOSIS — M25562 Pain in left knee: Secondary | ICD-10-CM | POA: Diagnosis not present

## 2017-02-23 DIAGNOSIS — M25562 Pain in left knee: Secondary | ICD-10-CM | POA: Diagnosis not present

## 2017-02-25 DIAGNOSIS — M25562 Pain in left knee: Secondary | ICD-10-CM | POA: Diagnosis not present

## 2017-03-02 DIAGNOSIS — M25562 Pain in left knee: Secondary | ICD-10-CM | POA: Diagnosis not present

## 2017-03-04 DIAGNOSIS — M25562 Pain in left knee: Secondary | ICD-10-CM | POA: Diagnosis not present

## 2017-03-10 DIAGNOSIS — M67431 Ganglion, right wrist: Secondary | ICD-10-CM | POA: Diagnosis not present

## 2017-03-11 DIAGNOSIS — M25562 Pain in left knee: Secondary | ICD-10-CM | POA: Diagnosis not present

## 2017-03-16 DIAGNOSIS — M25562 Pain in left knee: Secondary | ICD-10-CM | POA: Diagnosis not present

## 2017-03-18 DIAGNOSIS — M25562 Pain in left knee: Secondary | ICD-10-CM | POA: Diagnosis not present

## 2017-03-19 DIAGNOSIS — S82032D Displaced transverse fracture of left patella, subsequent encounter for closed fracture with routine healing: Secondary | ICD-10-CM | POA: Diagnosis not present

## 2017-03-25 DIAGNOSIS — M25562 Pain in left knee: Secondary | ICD-10-CM | POA: Diagnosis not present

## 2017-03-30 DIAGNOSIS — M25562 Pain in left knee: Secondary | ICD-10-CM | POA: Diagnosis not present

## 2017-04-01 DIAGNOSIS — M67431 Ganglion, right wrist: Secondary | ICD-10-CM | POA: Diagnosis not present

## 2017-04-15 DIAGNOSIS — M25562 Pain in left knee: Secondary | ICD-10-CM | POA: Diagnosis not present

## 2017-04-21 DIAGNOSIS — M25562 Pain in left knee: Secondary | ICD-10-CM | POA: Diagnosis not present

## 2017-04-27 DIAGNOSIS — M25562 Pain in left knee: Secondary | ICD-10-CM | POA: Diagnosis not present

## 2017-05-06 DIAGNOSIS — M25562 Pain in left knee: Secondary | ICD-10-CM | POA: Diagnosis not present

## 2017-05-11 DIAGNOSIS — M25562 Pain in left knee: Secondary | ICD-10-CM | POA: Diagnosis not present

## 2017-05-18 DIAGNOSIS — M25562 Pain in left knee: Secondary | ICD-10-CM | POA: Diagnosis not present

## 2017-05-25 DIAGNOSIS — M25562 Pain in left knee: Secondary | ICD-10-CM | POA: Diagnosis not present

## 2017-06-12 ENCOUNTER — Telehealth: Payer: Self-pay

## 2017-06-12 NOTE — Telephone Encounter (Signed)
Patient said she placed Estring on Tuesday night. She has not used it in 6 mos.  Then yesterday she noticed a little tenderness between her arm pit and breast (she said where lymph nodes are).  Then last night felt like her right breast felt fuller.    She asked if perhaps the Estring caused this and should she remove it.  I recommended office visit for breast exam and to check the tender areas. She scheduled visit for Tuesday. I left it up to her if she removed the Estring but I did tell her it might just be coincidental.  You are backup for [place] who is off. All that sounds Ok?

## 2017-06-12 NOTE — Telephone Encounter (Signed)
Sounds appropriate.  A little quick for Estring to be causing the breast tenderness but I suppose possible.  She could either leave it in place or remove it at her comfort level.  Regardless office visit with exam is wise

## 2017-06-15 DIAGNOSIS — H524 Presbyopia: Secondary | ICD-10-CM | POA: Diagnosis not present

## 2017-06-15 DIAGNOSIS — H5213 Myopia, bilateral: Secondary | ICD-10-CM | POA: Diagnosis not present

## 2017-06-15 DIAGNOSIS — H52223 Regular astigmatism, bilateral: Secondary | ICD-10-CM | POA: Diagnosis not present

## 2017-06-16 ENCOUNTER — Encounter: Payer: Self-pay | Admitting: Women's Health

## 2017-06-16 ENCOUNTER — Ambulatory Visit: Payer: BLUE CROSS/BLUE SHIELD | Admitting: Women's Health

## 2017-06-16 VITALS — BP 122/80 | Ht 64.0 in | Wt 126.0 lb

## 2017-06-16 DIAGNOSIS — N644 Mastodynia: Secondary | ICD-10-CM

## 2017-06-16 NOTE — Patient Instructions (Signed)

## 2017-06-16 NOTE — Progress Notes (Signed)
62 year old MWF G0 +2 adopted presents with complaint of right axilla discomfort times 1 day last week.  Replaced Estring day of occurrence of discomfort, now no discomfort.  Prior Estring 6 months earlier (forgot to change, left knee fracture from traumatic fall September 2018 ).  Denies breast injury, change in routine or exercise.   TAH with BSO in 91 for endometriosis.  Normal mammogram history, no breast cancer history in family.  Exam: Breast exam in sitting and lying position without visible retractions, dimpling, erythema.  No palpable nodules, no nipple discharge or tenderness with exam.  Normal breast exam  Plan: Reassurance given regarding normality of breast exam, resolution of tenderness.  Continue annual screening breast mammograms, due in June.  Instructed to call if tenderness returns or concerns.

## 2017-07-15 DIAGNOSIS — E782 Mixed hyperlipidemia: Secondary | ICD-10-CM | POA: Diagnosis not present

## 2017-07-15 DIAGNOSIS — R7989 Other specified abnormal findings of blood chemistry: Secondary | ICD-10-CM | POA: Diagnosis not present

## 2017-07-15 DIAGNOSIS — Z0000 Encounter for general adult medical examination without abnormal findings: Secondary | ICD-10-CM | POA: Diagnosis not present

## 2017-07-22 DIAGNOSIS — R319 Hematuria, unspecified: Secondary | ICD-10-CM | POA: Diagnosis not present

## 2017-07-22 DIAGNOSIS — Z0000 Encounter for general adult medical examination without abnormal findings: Secondary | ICD-10-CM | POA: Diagnosis not present

## 2017-08-11 DIAGNOSIS — D225 Melanocytic nevi of trunk: Secondary | ICD-10-CM | POA: Diagnosis not present

## 2017-08-11 DIAGNOSIS — C44519 Basal cell carcinoma of skin of other part of trunk: Secondary | ICD-10-CM | POA: Diagnosis not present

## 2017-08-11 DIAGNOSIS — D0461 Carcinoma in situ of skin of right upper limb, including shoulder: Secondary | ICD-10-CM | POA: Diagnosis not present

## 2017-08-11 DIAGNOSIS — D485 Neoplasm of uncertain behavior of skin: Secondary | ICD-10-CM | POA: Diagnosis not present

## 2017-08-11 DIAGNOSIS — Z85820 Personal history of malignant melanoma of skin: Secondary | ICD-10-CM | POA: Diagnosis not present

## 2017-08-11 DIAGNOSIS — D2272 Melanocytic nevi of left lower limb, including hip: Secondary | ICD-10-CM | POA: Diagnosis not present

## 2017-08-11 DIAGNOSIS — D0359 Melanoma in situ of other part of trunk: Secondary | ICD-10-CM | POA: Diagnosis not present

## 2017-08-11 DIAGNOSIS — D2262 Melanocytic nevi of left upper limb, including shoulder: Secondary | ICD-10-CM | POA: Diagnosis not present

## 2017-08-26 DIAGNOSIS — D0359 Melanoma in situ of other part of trunk: Secondary | ICD-10-CM | POA: Diagnosis not present

## 2017-08-26 DIAGNOSIS — C44519 Basal cell carcinoma of skin of other part of trunk: Secondary | ICD-10-CM | POA: Diagnosis not present

## 2017-09-08 DIAGNOSIS — D0461 Carcinoma in situ of skin of right upper limb, including shoulder: Secondary | ICD-10-CM | POA: Diagnosis not present

## 2017-09-08 DIAGNOSIS — X32XXXA Exposure to sunlight, initial encounter: Secondary | ICD-10-CM | POA: Diagnosis not present

## 2017-09-08 DIAGNOSIS — L570 Actinic keratosis: Secondary | ICD-10-CM | POA: Diagnosis not present

## 2017-09-09 DIAGNOSIS — R300 Dysuria: Secondary | ICD-10-CM | POA: Diagnosis not present

## 2017-09-14 DIAGNOSIS — N3289 Other specified disorders of bladder: Secondary | ICD-10-CM | POA: Diagnosis not present

## 2017-09-14 DIAGNOSIS — R310 Gross hematuria: Secondary | ICD-10-CM | POA: Diagnosis not present

## 2017-09-15 ENCOUNTER — Other Ambulatory Visit: Payer: Self-pay | Admitting: Urology

## 2017-09-15 DIAGNOSIS — R310 Gross hematuria: Secondary | ICD-10-CM

## 2017-09-18 ENCOUNTER — Ambulatory Visit
Admission: RE | Admit: 2017-09-18 | Discharge: 2017-09-18 | Disposition: A | Discharge status: Home / Self Care | Payer: BLUE CROSS/BLUE SHIELD | Source: Ambulatory Visit | Attending: Urology | Admitting: Urology

## 2017-09-18 DIAGNOSIS — R310 Gross hematuria: Secondary | ICD-10-CM | POA: Insufficient documentation

## 2017-09-18 DIAGNOSIS — N210 Calculus in bladder: Secondary | ICD-10-CM | POA: Insufficient documentation

## 2017-09-18 MED ORDER — IOPAMIDOL (ISOVUE-300) INJECTION 61%
125.0000 mL | Freq: Once | INTRAVENOUS | Status: AC | PRN
  Administered 2017-09-18: 125 mL via INTRAVENOUS

## 2017-09-22 DIAGNOSIS — R310 Gross hematuria: Secondary | ICD-10-CM | POA: Diagnosis not present

## 2017-09-22 DIAGNOSIS — N320 Bladder-neck obstruction: Secondary | ICD-10-CM | POA: Diagnosis not present

## 2017-09-22 DIAGNOSIS — N210 Calculus in bladder: Secondary | ICD-10-CM | POA: Diagnosis not present

## 2017-09-24 DIAGNOSIS — Z1231 Encounter for screening mammogram for malignant neoplasm of breast: Secondary | ICD-10-CM | POA: Diagnosis not present

## 2017-10-16 DIAGNOSIS — L986 Other infiltrative disorders of the skin and subcutaneous tissue: Secondary | ICD-10-CM | POA: Diagnosis not present

## 2017-10-16 DIAGNOSIS — D485 Neoplasm of uncertain behavior of skin: Secondary | ICD-10-CM | POA: Diagnosis not present

## 2017-10-16 NOTE — H&P (Signed)
[name], Jennifer Hammond MEDICAL RECORD [id number] ACCOUNT [id number] DATE OF BIRTH:03-Oct-1955 FACILITY: [facility] LOCATION: [facility] PHYSICIAN:Jennifer Ellerman Farrel Conners, MD  HISTORY AND PHYSICAL  DATE OF ADMISSION:  10/27/2017  Same-day surgery 10/27/2017.  CHIEF COMPLAINT:  Gross hematuria.  HISTORY OF PRESENT ILLNESS:  The patient is a 62 year old white female who presented to the office with 2 episodes of gross painless hematuria in the past several months.  She was evaluated in the office with CT scan 06/21 which revealed a 3 mm bladder  stone but normal upper tracts.  Urine cytology revealed dysplastic urothelial cells.  Cystoscopy on 06/25 indicated a 4 x 4 mm polypoid lesion of the right posterior bladder wall and a 3 mm bladder stone.  The patient comes in now for bladder biopsy and  removal of the bladder stone.  ALLERGIES:  No drug allergies.  CURRENT MEDICATIONS:  Estring, fluoxetine, vitamin D3, calcium, Super B complex, vitamin B12, Centrum Silver, Zyrtec, alprazolam, Flonase, Gaviscon, Advil, and rizatriptan.  PAST SURGICAL HISTORY: 1.  Hysterectomy in 1991 due to endometriosis. 2.  Eyelid surgery 2011. 3.  Left hip replacement in 2016.  4.  Repair of a fractured left patella 2018.  PAST AND CURRENT MEDICAL CONDITIONS: 1.  GERD. 2.  Migraine headaches. 3.  Osteoporosis. 4.  Hyperlipidemia. 5.  History of basal cell carcinoma and melanoma of the chest, status post removal 2012 and 2019.  REVIEW OF SYSTEMS:  The patient denies chest pain, shortness of breath, diabetes, stroke, or heart disease.  She does have allergic rhinitis and decreased visual acuity.  FAMILY HISTORY:  The patient's father died at age 36 but had a history of bladder cancer prior to death and died of a stroke.  Mother is living at age 65 with history of kidney stones.  SOCIAL HISTORY:  The patient denied tobacco use.  She consumes 3 alcoholic beverages per week.  PHYSICAL EXAMINATION: GENERAL:  A  well-nourished white female in no distress. HEENT:  Sclerae were clear.  Pupils are equally round, reactive to light and accommodation. NECK:  No palpable cervical adenopathy. PULMONARY:  Lungs were clear to auscultation. CARDIOVASCULAR:  Regular rhythm and rate without audible murmurs. ABDOMEN:  Soft, nontender abdomen.  No CVA tenderness. GENITOURINARY:  A atrophic external genitalia.  Normal urethral meatus.  Good anterior vaginal support. NEUROMUSCULAR:  Alert and oriented x3.  IMPRESSION:  1.  Bladder polyp versus urothelial carcinoma. 2.  Bladder stone.  PLAN: 1.  Bladder biopsy. 2.  Removal of bladder stones.  LN/NUANCE  D:10/16/2017 T:10/16/2017 JOB:[id number]

## 2017-10-19 ENCOUNTER — Other Ambulatory Visit: Payer: Self-pay

## 2017-10-19 ENCOUNTER — Encounter
Admission: RE | Admit: 2017-10-19 | Discharge: 2017-10-19 | Disposition: A | Discharge status: Home / Self Care | Payer: BLUE CROSS/BLUE SHIELD | Source: Ambulatory Visit | Attending: Urology | Admitting: Urology

## 2017-10-19 HISTORY — DX: Other specified postprocedural states: Z98.890

## 2017-10-19 HISTORY — DX: Headache: R51

## 2017-10-19 HISTORY — DX: Other complications of anesthesia, initial encounter: T88.59XA

## 2017-10-19 HISTORY — DX: Adverse effect of unspecified anesthetic, initial encounter: T41.45XA

## 2017-10-19 HISTORY — DX: Anxiety disorder, unspecified: F41.9

## 2017-10-19 HISTORY — DX: Other specified postprocedural states: R11.2

## 2017-10-19 HISTORY — DX: Gastro-esophageal reflux disease without esophagitis: K21.9

## 2017-10-19 HISTORY — DX: Headache, unspecified: R51.9

## 2017-10-19 HISTORY — DX: Nonrheumatic mitral (valve) prolapse: I34.1

## 2017-10-19 NOTE — Patient Instructions (Signed)
Your procedure is scheduled on: 10-27-17 Report to Same Day Surgery 2nd floor medical mall [facility] Entrance-take elevator on left to 2nd floor.  Check in with surgery information desk.) To find out your arrival time please call [phone] between 1PM - 3PM on 10-26-17  Remember: Instructions that are not followed completely may result in serious medical risk, up to and including death, or upon the discretion of your surgeon and anesthesiologist your surgery may need to be rescheduled.    _x___ 1. Do not eat food after midnight the night before your procedure. NO GUM OR CANDY AFTER MIDNIGHT.  You may drink clear liquids up to 2 hours before you are scheduled to arrive at the hospital for your procedure.  Do not drink clear liquids within 2 hours of your scheduled arrival to the hospital.  Clear liquids include  --Water or Apple juice without pulp  --Clear carbohydrate beverage such as ClearFast or Gatorade  --Black Coffee or Clear Tea (No milk, no creamers, do not add anything to the coffee or Tea     __x__ 2. No Alcohol for 24 hours before or after surgery.   __x__3. No Smoking or e-cigarettes for 24 prior to surgery.  Do not use any chewable tobacco products for at least 6 hour prior to surgery   ____  4. Bring all medications with you on the day of surgery if instructed.    __x__ 5. Notify your doctor if there is any change in your medical condition     (cold, fever, infections).    x___6. On the morning of surgery brush your teeth with toothpaste and water.  You may rinse your mouth with mouth wash if you wish.  Do not swallow any toothpaste or mouthwash.   Do not wear jewelry, make-up, hairpins, clips or nail polish.  Do not wear lotions, powders, or perfumes. You may wear deodorant.  Do not shave 48 hours prior to surgery. Men may shave face and neck.  Do not bring valuables to the hospital.    [facility] is not responsible for any belongings or valuables.    Contacts, dentures or bridgework may not be worn into surgery.  Leave your suitcase in the car. After surgery it may be brought to your room.  For patients admitted to the hospital, discharge time is determined by your treatment team.  _  Patients discharged the day of surgery will not be allowed to drive home.  You will need someone to drive you home and stay with you the night of your procedure.    Please read over the following fact sheets that you were given:   [facility] Preparing for Surgery   _x___ Take anti-hypertensive listed below, cardiac, seizure, asthma, anti-reflux and psychiatric medicines. These include:  1. YOU MAY TAKE YOUR XANAX DAY OF SURGERY IF NEEDED WITH A SMALL SIP OF WATER  2.  3.  4.  5.  6.  ____Fleets enema or Magnesium Citrate as directed.   ____ Use CHG Soap or sage wipes as directed on instruction sheet   ____ Use inhalers on the day of surgery and bring to hospital day of surgery  ____ Stop Metformin and Janumet 2 days prior to surgery.    ____ Take 1/2 of usual insulin dose the night before surgery and none on the morning surgery.   ____ Follow recommendations from Cardiologist, Pulmonologist or PCP regarding stopping Aspirin, Coumadin, Plavix ,Eliquis, Effient, or Pradaxa, and Pletal.  X____Stop Anti-inflammatories such as  Advil, Aleve, Ibuprofen, Motrin, Naproxen, Naprosyn, Goodies powders or aspirin products NOW-OK to take Tylenol .   ____ Stop supplements until after surgery.    ____ Bring C-Pap to the hospital.

## 2017-10-19 NOTE — Pre-Procedure Instructions (Signed)
ECG 12-lead5/07/2014 [place]ie Component Name Value Ref Range  Vent Rate (bpm)    PR Interval (msec)    QRS Interval (msec)    QT Interval (msec)    QTc (msec)    Result Narrative  Normal sinus rhythm Normal ECG No previous ECGs available I reviewed and concur with this report. Electronically signed by:HENKE MD, [place] ([id number]) on 08/03/2014 12:03:03 PM  Status Results Details   Encounter Summary

## 2017-10-22 NOTE — H&P (Signed)
Jennifer Hammond, [name] ACCOUNT [id number] DATE OF BIRTH:February 28, 1956 FACILITY: [facility] LOCATION: [facility] PHYSICIAN:Bishop Vanderwerf Farrel Conners, MD  HISTORY AND PHYSICAL  DATE OF ADMISSION:  10/27/2017  CHIEF COMPLAINT:  Gross hematuria.  HISTORY OF PRESENT ILLNESS:  The patient is a 62 year old white female who had 2 episodes of gross painless hematuria this summer.  She was evaluated with CT scan on 06/21 which indicated normal upper urinary tract.  She was found to have a 3 mm bladder  stone at that time.  She subsequently underwent cystoscopy on 06/25 which revealed a 4 x 4 mm polypoid lesion of the bladder as well as a 3 mm bladder stone.  Urine cytology revealed some dysplastic cells.  The patient comes in now for transurethral  resection of bladder tumor and removal of bladder stone.  ALLERGIES:  No drug allergies.  MEDICATIONS:  Estring, fluoxetine, vitamin D3, calcium, Super B complex, vitamin B12, Centrum Silver, Zyrtec, alprazolam, Flonase, Gaviscon, Advil and rizatriptan.  PAST SURGICAL HISTORY: 1.  Hysterectomy in 1991 due to endometriosis. 2.  Eyelid surgery 2011. 3.  Left hip replacement in 2016. 4.  Repair of fractured left patella in 2018.  PAST MEDICAL CONDITIONS: 1.  GERD. 2.  Migraine headaches. 3.  Osteoporosis. 4.  Hyperlipidemia. 5.  History of basal cell carcinoma and melanoma of the chest, status post removal 2012 and 2019.  REVIEW OF SYSTEMS:  The patient has allergic rhinitis and decreased visual acuity.  She denies chest pain, shortness of breath, diabetes, stroke or heart disease.  FAMILY HISTORY:  The patient's father died at age 78 but had a history of bladder cancer prior to death and died of a stroke.  Mother is living, age 73 with history of kidney stones.  PHYSICAL EXAMINATION: VITAL SIGNS:  Weight 126, height 5 feet 3 inches. GENERAL:  A well-nourished, white female in no distress. HEENT:  Sclerae were clear.  Pupils are  equally round, reactive to light and accommodation.  Extraocular movements were intact. NECK:  No palpable cervical masses.  No palpable lymphadenopathy. PULMONARY:  Lungs clear to auscultation. HEART:  Regular rhythm and rate without audible murmurs. ABDOMEN:  Soft, nontender abdomen. GENITOURINARY:  Atrophic external genitalia. EXTREMITIES:  Normal urethral meatus.  Good anterior support. NEUROMUSCULAR:  Alert and oriented x3.  ASSESSMENT: 1.  Gross hematuria. 2.  Polypoid bladder lesion. 3.  Bladder stone.  PLAN: 1.  Transurethral resection of bladder tumor. 2.  Removal of bladder stone.  TN/NUANCE  D:10/22/2017 T:10/22/2017 JOB:[id number]

## 2017-10-26 ENCOUNTER — Encounter: Payer: Self-pay | Admitting: *Deleted

## 2017-10-26 NOTE — Pre-Procedure Instructions (Signed)
Echocardiogram 2D complete4/26/2016 [facility] Result Narrative  INTERNAL MEDICINE DEPARTMENT [place], [place]  [place] [id number] [facility] Acct #: [id number] [place], [place] [id number]: 07/25/2014 04:03 PM  Adult Female Age: 62 yrs ECHOCARDIOGRAM REPORTOutpatient  STUDY:CHEST WALL TAPE:0000:00: 0:00:00KC::KCWI ECHO:Yes DOPPLER:YesFILE:0000-000-000MD1:MILLER, MARK  COLOR:YesCONTRAST:No MACHINE:PhilipsHeight: 83 in  RV BIOPSY:No 3D:NoSOUND QLTY:Moderate Weight: 126 lb MEDIUM:None  BSA: 1.6 m2 ___________________________________________________________________________________________  HISTORY:DOE REASON:Assess, LV function INDICATION:R06.09 Dyspnea on exertion  ___________________________________________________________________________________________ ECHOCARDIOGRAPHIC MEASUREMENTS 2D DIMENSIONS AORTA ValuesNormal RangeMAIN PAValuesNormal Range Annulus:nm* [2.1 - 2.5]PA Main:nm* [1.5 - 2.1] Aorta Sin:nm* [2.7 - 3.3] RIGHT VENTRICLE ST Junction:nm* [2.3 - 2.9]RV Base:nm* [ < 4.2] Asc.Aorta:nm* [2.3 - 3.1] RV Mid:nm* [ < 3.5]  LEFT VENTRICLERV Length:nm* [ < 8.6] LVIDd:4.5 cm[3.9 - 5.3] INFERIOR VENA  CAVA LVIDs:3.0 cmMax. IVC:nm* [ <= 2.1]  FS:33.3 %[> 25]Min. IVC:nm* SWT:0.50 cm [0.5 - 0.9] ------------------ PWT:0.70 cm [0.5 - 0.9] nm* - not measured  LEFT ATRIUM LA Diam:3.2 cm[2.7 - 3.8] LA A4C Area:nm* [ < 20] LA Volume:nm* [22 - 52]  ___________________________________________________________________________________________ ECHOCARDIOGRAPHIC DESCRIPTIONS  AORTIC ROOT Size:Normal Dissection:INDETERM FOR DISSECTION  AORTIC VALVE Leaflets:Tricuspid Morphology:Normal Mobility:Fully mobile  LEFT VENTRICLE Size:NormalAnterior:Normal  Contraction:Normal Lateral:Normal Closest EF:>55% (Estimated)Septal:Normal  LV Masses:No Masses Apical:Normal  XMI:WOEHOZYYQMGN:OIBBCW Posterior:Normal Dias.FxClass:(Grade 1) relaxation abnormal, E/A reversal  MITRAL VALVE Leaflets:NormalMobility:Fully mobile Morphology:PROLAPSE  LEFT ATRIUM Size:Normal LA Masses:No masses  IA Septum:Normal IAS  MAIN PA Size:Normal  PULMONIC VALVE Morphology:NormalMobility:Fully mobile  RIGHT VENTRICLE  RV Masses:No Masses Size:Normal  Free Wall:Normal Contraction:Normal  TRICUSPID VALVE Leaflets:NormalMobility:Fully mobile Morphology:Normal  RIGHT ATRIUM Size:NormalRA Other:None  RA Mass:No masses  PERICARDIUM  Fluid:No effusion  INFERIOR VENACAVA Size:Normal Normal respiratory collapse   ___________________________________________________________________ DOPPLER  ECHO and OTHER SPECIAL PROCEDURES  Aortic:TRIVIAL ARNo AS   Mitral:MILD MR No MS MV Inflow E Vel=78.5 cm/sec MV Annulus E'Vel=nm* E/E'Ratio=nm*  Tricuspid:MODERATE TR No TS 227.0 cm/sec peak TR vel25.6 mmHg peak RV pressure  Pulmonary:MILD PR No PS     ___________________________________________________________________________________________ INTERPRETATION NORMAL LEFT VENTRICULAR SYSTOLIC FUNCTION NORMAL RIGHT VENTRICULAR SYSTOLIC FUNCTION MODERATE VALVULAR REGURGITATION (See above) NO VALVULAR STENOSIS Mod/severe TR seen   ___________________________________________________________________________________________ Electronically signed by: Rusty Aus, MD on 07/27/2014 06:54 PM Performed By: Scherrie November, RCS Ordering Physician: Emily Filbert  ___________________________________________________________________________________________  Other Result Information  Interface, Text Results In - 07/27/2014  6:55 PM EDT                 INTERNAL MEDICINE DEPARTMENT                     [place], [facility]                        [place]                           [id number]                   [facility]                       Acct #: [id number]         [address], [place],  [place] [address]            Date: 07/25/2014 04:03 PM  Adult Female Age: 84 yrs                     ECHOCARDIOGRAM REPORT                        Outpatient          STUDY:CHEST WALL         TAPE:0000:00: 0:00:00          KC::KCWI           ECHO:Yes   DOPPLER:Yes  FILE:0000-000-000              MD1:  MILLER, MARK          COLOR:Yes  CONTRAST:No       MACHINE:Philips            Height: 65 in      RV BIOPSY:No         3D:No    SOUND QLTY:Moderate           Weight: 126 lb         MEDIUM:None                                                                   BSA: 1.6 m2 ___________________________________________________________________________________________                  HISTORY:DOE                   REASON:Assess, LV function               INDICATION:R06.09 Dyspnea on exertion  ___________________________________________________________________________________________ ECHOCARDIOGRAPHIC MEASUREMENTS 2D DIMENSIONS AORTA             Values      Normal Range      MAIN PA          Values      Normal Range           Annulus:  nm*       [2.1 - 2.5]                PA Main:  nm*       [1.5 - 2.1]         Aorta Sin:  nm*       [2.7 - 3.3]       RIGHT VENTRICLE       ST Junction:  nm*       [2.3 - 2.9]                RV Base:  nm*       [ < 4.2]         Asc.Aorta:  nm*       [2.3 - 3.1]                 RV Mid:  nm*       [ < 3.5]  LEFT VENTRICLE                                        RV Length:  nm*       [ < 8.6]  LVIDd:  4.5 cm    [3.9 - 5.3]       INFERIOR VENA CAVA             LVIDs:  3.0 cm                              Max. IVC:  nm*       [ <= 2.1]                FS:  33.3 %    [> 25]                    Min. IVC:  nm*               SWT:  0.50 cm   [0.5 - 0.9]                   ------------------               PWT:  0.70 cm   [0.5 - 0.9]                   nm* - not measured  LEFT ATRIUM           LA Diam:  3.2 cm    [2.7 - 3.8]       LA A4C Area:  nm*       [ < 20]         LA Volume:  nm*       [22 - 52]  ___________________________________________________________________________________________ ECHOCARDIOGRAPHIC DESCRIPTIONS  AORTIC ROOT         Size:Normal   Dissection:INDETERM FOR DISSECTION  AORTIC VALVE     Leaflets:Tricuspid           Morphology:Normal     Mobility:Fully mobile  LEFT VENTRICLE         Size:Normal                Anterior:Normal  Contraction:Normal                 Lateral:Normal   Closest EF:>55% (Estimated)        Septal:Normal    LV  Masses:No Masses               Apical:Normal          GEZ:MOQH                  Inferior:Normal                                   Posterior:Normal Dias.FxClass:(Grade 1) relaxation abnormal, E/A reversal  MITRAL VALVE     Leaflets:Normal                Mobility:Fully mobile   Morphology:PROLAPSE  LEFT ATRIUM         Size:Normal               LA Masses:No masses    IA Septum:Normal IAS  MAIN PA         Size:Normal  PULMONIC VALVE   Morphology:Normal                Mobility:Fully mobile  RIGHT VENTRICLE    RV Masses:No Masses                 Size:Normal  Free Wall:Normal             Contraction:Normal  TRICUSPID VALVE     Leaflets:Normal                Mobility:Fully mobile   Morphology:Normal  RIGHT ATRIUM         Size:Normal                RA Other:None      RA Mass:No masses  PERICARDIUM        Fluid:No effusion  INFERIOR VENACAVA         Size:Normal Normal respiratory collapse   ___________________________________________________________________ DOPPLER ECHO and OTHER SPECIAL PROCEDURES    Aortic:TRIVIAL AR                    No AS     Mitral:MILD MR                       No MS           MV Inflow E Vel=78.5 cm/sec   MV Annulus E'Vel=nm*           E/E'Ratio=nm*  Tricuspid:MODERATE TR                   No TS           227.0 cm/sec peak TR vel      25.6 mmHg peak RV pressure  Pulmonary:MILD PR                       No PS     ___________________________________________________________________________________________ INTERPRETATION NORMAL LEFT VENTRICULAR SYSTOLIC FUNCTION NORMAL RIGHT VENTRICULAR SYSTOLIC FUNCTION MODERATE VALVULAR REGURGITATION (See above) NO VALVULAR STENOSIS Mod/severe TR seen   ___________________________________________________________________________________________ Electronically signed by: Rusty Aus, MD on 07/27/2014 06:54 PM             Performed By: Scherrie November, RCS       Ordering Physician: Emily Filbert  ___________________________________________________________________________________________

## 2017-10-27 ENCOUNTER — Encounter
Admission: RE | Disposition: A | Discharge status: Home / Self Care | Payer: Self-pay | Source: Ambulatory Visit | Attending: Urology

## 2017-10-27 ENCOUNTER — Other Ambulatory Visit: Payer: Self-pay

## 2017-10-27 ENCOUNTER — Ambulatory Visit
Admission: RE | Admit: 2017-10-27 | Discharge: 2017-10-27 | Disposition: A | Discharge status: Home / Self Care | Payer: BLUE CROSS/BLUE SHIELD | Source: Ambulatory Visit | Attending: Urology | Admitting: Urology

## 2017-10-27 ENCOUNTER — Ambulatory Visit: Payer: BLUE CROSS/BLUE SHIELD | Admitting: Certified Registered"

## 2017-10-27 DIAGNOSIS — Z79890 Hormone replacement therapy (postmenopausal): Secondary | ICD-10-CM | POA: Insufficient documentation

## 2017-10-27 DIAGNOSIS — Z85828 Personal history of other malignant neoplasm of skin: Secondary | ICD-10-CM | POA: Insufficient documentation

## 2017-10-27 DIAGNOSIS — J309 Allergic rhinitis, unspecified: Secondary | ICD-10-CM | POA: Diagnosis not present

## 2017-10-27 DIAGNOSIS — G43909 Migraine, unspecified, not intractable, without status migrainosus: Secondary | ICD-10-CM | POA: Insufficient documentation

## 2017-10-27 DIAGNOSIS — Z8052 Family history of malignant neoplasm of bladder: Secondary | ICD-10-CM | POA: Diagnosis not present

## 2017-10-27 DIAGNOSIS — M810 Age-related osteoporosis without current pathological fracture: Secondary | ICD-10-CM | POA: Diagnosis not present

## 2017-10-27 DIAGNOSIS — F419 Anxiety disorder, unspecified: Secondary | ICD-10-CM | POA: Insufficient documentation

## 2017-10-27 DIAGNOSIS — C674 Malignant neoplasm of posterior wall of bladder: Secondary | ICD-10-CM | POA: Insufficient documentation

## 2017-10-27 DIAGNOSIS — N210 Calculus in bladder: Secondary | ICD-10-CM | POA: Insufficient documentation

## 2017-10-27 DIAGNOSIS — N3289 Other specified disorders of bladder: Secondary | ICD-10-CM | POA: Diagnosis not present

## 2017-10-27 DIAGNOSIS — Z823 Family history of stroke: Secondary | ICD-10-CM | POA: Diagnosis not present

## 2017-10-27 DIAGNOSIS — Z96642 Presence of left artificial hip joint: Secondary | ICD-10-CM | POA: Diagnosis not present

## 2017-10-27 DIAGNOSIS — Z7951 Long term (current) use of inhaled steroids: Secondary | ICD-10-CM | POA: Insufficient documentation

## 2017-10-27 DIAGNOSIS — Z79899 Other long term (current) drug therapy: Secondary | ICD-10-CM | POA: Diagnosis not present

## 2017-10-27 DIAGNOSIS — D494 Neoplasm of unspecified behavior of bladder: Secondary | ICD-10-CM | POA: Diagnosis not present

## 2017-10-27 DIAGNOSIS — Z90710 Acquired absence of both cervix and uterus: Secondary | ICD-10-CM | POA: Insufficient documentation

## 2017-10-27 HISTORY — PX: TRANSURETHRAL RESECTION OF BLADDER TUMOR WITH MITOMYCIN-C: SHX6459

## 2017-10-27 SURGERY — TRANSURETHRAL RESECTION OF BLADDER TUMOR WITH MITOMYCIN-C
Anesthesia: General | Site: Bladder | Wound class: Clean Contaminated

## 2017-10-27 MED ORDER — MIDAZOLAM HCL 2 MG/2ML IJ SOLN
INTRAMUSCULAR | Status: DC | PRN
  Administered 2017-10-27: 2 mg via INTRAVENOUS

## 2017-10-27 MED ORDER — LACTATED RINGERS IV SOLN
INTRAVENOUS | Status: DC
  Administered 2017-10-27: 13:00:00 via INTRAVENOUS

## 2017-10-27 MED ORDER — BELLADONNA ALKALOIDS-OPIUM 16.2-60 MG RE SUPP
RECTAL | Status: AC
  Filled 2017-10-27: qty 1

## 2017-10-27 MED ORDER — LIDOCAINE HCL (CARDIAC) PF 100 MG/5ML IV SOSY
PREFILLED_SYRINGE | INTRAVENOUS | Status: DC | PRN
  Administered 2017-10-27: 60 mg via INTRAVENOUS

## 2017-10-27 MED ORDER — MIDAZOLAM HCL 2 MG/2ML IJ SOLN
INTRAMUSCULAR | Status: AC
  Filled 2017-10-27: qty 2

## 2017-10-27 MED ORDER — LIDOCAINE HCL URETHRAL/MUCOSAL 2 % EX GEL
CUTANEOUS | Status: AC
  Filled 2017-10-27: qty 10

## 2017-10-27 MED ORDER — CEFAZOLIN SODIUM-DEXTROSE 1-4 GM/50ML-% IV SOLN
1.0000 g | Freq: Once | INTRAVENOUS | Status: AC
  Administered 2017-10-27: 1 g via INTRAVENOUS

## 2017-10-27 MED ORDER — FENTANYL CITRATE (PF) 100 MCG/2ML IJ SOLN
INTRAMUSCULAR | Status: DC | PRN
  Administered 2017-10-27 (×2): 50 ug via INTRAVENOUS

## 2017-10-27 MED ORDER — MITOMYCIN CHEMO FOR BLADDER INSTILLATION 40 MG
40.0000 mg | Freq: Once | INTRAVENOUS | Status: AC
  Administered 2017-10-27: 40 mg via INTRAVESICAL
  Filled 2017-10-27: qty 40

## 2017-10-27 MED ORDER — ONDANSETRON HCL 4 MG/2ML IJ SOLN
INTRAMUSCULAR | Status: DC | PRN
  Administered 2017-10-27: 4 mg via INTRAVENOUS

## 2017-10-27 MED ORDER — FAMOTIDINE 20 MG PO TABS
20.0000 mg | ORAL_TABLET | Freq: Once | ORAL | Status: AC
  Administered 2017-10-27: 20 mg via ORAL

## 2017-10-27 MED ORDER — ONDANSETRON HCL 4 MG/2ML IJ SOLN: 4.0000 mg | Freq: Once | INTRAMUSCULAR | Status: DC | PRN

## 2017-10-27 MED ORDER — FENTANYL CITRATE (PF) 100 MCG/2ML IJ SOLN
INTRAMUSCULAR | Status: AC
  Filled 2017-10-27: qty 2

## 2017-10-27 MED ORDER — EPHEDRINE SULFATE 50 MG/ML IJ SOLN
INTRAMUSCULAR | Status: DC | PRN
  Administered 2017-10-27 (×2): 10 mg via INTRAVENOUS

## 2017-10-27 MED ORDER — FAMOTIDINE 20 MG PO TABS
ORAL_TABLET | ORAL | Status: AC
  Filled 2017-10-27: qty 1

## 2017-10-27 MED ORDER — PROPOFOL 10 MG/ML IV BOLUS
INTRAVENOUS | Status: AC
  Filled 2017-10-27: qty 20

## 2017-10-27 MED ORDER — PROPOFOL 10 MG/ML IV BOLUS
INTRAVENOUS | Status: DC | PRN
  Administered 2017-10-27: 110 mg via INTRAVENOUS

## 2017-10-27 MED ORDER — FENTANYL CITRATE (PF) 100 MCG/2ML IJ SOLN: 25.0000 ug | INTRAMUSCULAR | Status: DC | PRN

## 2017-10-27 MED ORDER — LIDOCAINE HCL URETHRAL/MUCOSAL 2 % EX GEL
CUTANEOUS | Status: DC | PRN
  Administered 2017-10-27: 1 via URETHRAL

## 2017-10-27 MED ORDER — BELLADONNA ALKALOIDS-OPIUM 16.2-60 MG RE SUPP
RECTAL | Status: DC | PRN
  Administered 2017-10-27: 1 via RECTAL

## 2017-10-27 MED ORDER — URIBEL 118 MG PO CAPS: 1.0000 | ORAL_CAPSULE | Freq: Four times a day (QID) | ORAL | 3 refills | Status: DC | PRN

## 2017-10-27 MED ORDER — PHENYLEPHRINE HCL 10 MG/ML IJ SOLN
INTRAMUSCULAR | Status: DC | PRN
  Administered 2017-10-27: 100 ug via INTRAVENOUS

## 2017-10-27 MED ORDER — CEFAZOLIN SODIUM-DEXTROSE 1-4 GM/50ML-% IV SOLN
INTRAVENOUS | Status: AC
  Filled 2017-10-27: qty 50

## 2017-10-27 MED ORDER — DEXAMETHASONE SODIUM PHOSPHATE 10 MG/ML IJ SOLN
INTRAMUSCULAR | Status: DC | PRN
  Administered 2017-10-27: 10 mg via INTRAVENOUS

## 2017-10-27 SURGICAL SUPPLY — 22 items
BAG DRAIN CYSTO-URO LG1000N (MISCELLANEOUS) ×3 IMPLANT
BAG URINE DRAINAGE (UROLOGICAL SUPPLIES) ×5 IMPLANT
CATH FOL 2WAY LX 16X30 (CATHETERS) ×2 IMPLANT
CATH FOLEY 2WAY  5CC 20FR SIL (CATHETERS) ×1
CATH FOLEY 2WAY 5CC 20FR SIL (CATHETERS) ×2 IMPLANT
DRSG TELFA 4X3 1S NADH ST (GAUZE/BANDAGES/DRESSINGS) ×3 IMPLANT
ELECT LOOP 22F BIPOLAR SML (ELECTROSURGICAL)
ELECTRODE LOOP 22F BIPOLAR SML (ELECTROSURGICAL) IMPLANT
GLOVE BIO SURGEON STRL SZ7.5 (GLOVE) ×3 IMPLANT
GOWN STRL REUS W/ TWL LRG LVL4 (GOWN DISPOSABLE) ×2 IMPLANT
GOWN STRL REUS W/ TWL XL LVL3 (GOWN DISPOSABLE) ×2 IMPLANT
GOWN STRL REUS W/TWL LRG LVL4 (GOWN DISPOSABLE) ×3
GOWN STRL REUS W/TWL XL LVL3 (GOWN DISPOSABLE) ×3
KIT TURNOVER CYSTO (KITS) ×3 IMPLANT
LOOP CUT BIPOLAR 24F LRG (ELECTROSURGICAL) ×1 IMPLANT
PACK CYSTO AR (MISCELLANEOUS) ×3 IMPLANT
SET IRRIG Y TYPE TUR BLADDER L (SET/KITS/TRAYS/PACK) ×6 IMPLANT
SOL .9 NS 3000ML IRR  AL (IV SOLUTION) ×4
SOL .9 NS 3000ML IRR AL (IV SOLUTION) ×8
SOL .9 NS 3000ML IRR UROMATIC (IV SOLUTION) ×8 IMPLANT
SYRINGE IRR TOOMEY STRL 70CC (SYRINGE) ×3 IMPLANT
WATER STERILE IRR 1000ML POUR (IV SOLUTION) ×3 IMPLANT

## 2017-10-27 NOTE — Anesthesia Post-op Follow-up Note (Signed)
Anesthesia QCDR form completed.        

## 2017-10-27 NOTE — H&P (Signed)
Date of Initial H&P: 10/22/17  History reviewed, patient examined, no change in status, stable for surgery.

## 2017-10-27 NOTE — Op Note (Signed)
Preoperative diagnosis: 1.  Bladder lesion                                             2.  Bladder mucosal calcification  Postoperative diagnosis: Same  Procedure: 1.  Bladder biopsy                      2.  Removal of bladder mucosal calcification                      3.  Bladder fulguration  Surgeon: Otelia Limes. Yves Dill MD  Anesthesia: General  Indications:See the history and physical. After informed consent the above procedure(s) were requested     Technique and findings: After adequate general anesthesia been obtained the patient was placed into dorsal lithotomy position and the perineum was prepped and draped in the usual fashion.  Urethra was dilated to 39 [place] with the female dilators.  A 24 [place] scope was coupled the camera and advanced into the bladder.  Bladder was thoroughly inspected.  Both ureteral orifices were identified and had clear efflux.  Patient had a 3 mm polypoid lesion in the posterior bladder wall region.  Using the cold cup biopsy forceps the lesion was removed and sent to pathology.  Patient also had a 1 x 1 cm erythematous of the posterior bladder wall with a 2 mm calcification attached to the mucosa.  Calcification and lying mucosa were removed with the cold cup biopsy forceps.  Biopsy sites and edematous area were cauterized with the Bugbee electrode and rollerball electrode.  The cystoscope was then removed and 18 French Foley catheter was placed.  10 cc of viscous Xylocaine was instilled within the bladder.  40 mg of mitomycin-C was then instilled within the bladder and the catheter was clamped.  A B&O suppository was placed.  The procedure was then terminated and patient transferred to the recovery room in stable condition.

## 2017-10-27 NOTE — Anesthesia Postprocedure Evaluation (Signed)
Anesthesia Post Note  Patient: Jennifer Hammond  Procedure(s) Performed: TRANSURETHRAL RESECTION OF BLADDER TUMOR WITH MITOMYCIN-C (N/A Bladder)  Patient location during evaluation: PACU Anesthesia Type: General Level of consciousness: awake and alert and oriented Pain management: pain level controlled Vital Signs Assessment: post-procedure vital signs reviewed and stable Respiratory status: spontaneous breathing Cardiovascular status: blood pressure returned to baseline Anesthetic complications: no     Last Vitals:  Vitals:   10/27/17 1556 10/27/17 1604  BP: 132/69 135/79  Pulse: 79 80  Resp: 15 16  Temp: 36.5 C (!) 36.2 C  SpO2: 99% 97%    Last Pain:  Vitals:   10/27/17 1604  TempSrc: Temporal  PainSc: 0-No pain                 Lamika Connolly

## 2017-10-27 NOTE — Anesthesia Preprocedure Evaluation (Signed)
Anesthesia Evaluation  Patient identified by MRN, date of birth, ID band Patient awake    Reviewed: Allergy & Precautions, NPO status , Patient's Chart, lab work & pertinent test results  History of Anesthesia Complications (+) PONV and history of anesthetic complications  Airway Mallampati: III  TM Distance: >3 FB     Dental  (+) Teeth Intact   Pulmonary neg pulmonary ROS,    Pulmonary exam normal        Cardiovascular negative cardio ROS Normal cardiovascular exam+ Valvular Problems/Murmurs MVP      Neuro/Psych  Headaches, Anxiety    GI/Hepatic Neg liver ROS, GERD  Medicated and Controlled,  Endo/Other    Renal/GU negative Renal ROS     Musculoskeletal  (+) Arthritis , Osteoarthritis,    Abdominal Normal abdominal exam  (+)   Peds negative pediatric ROS (+)  Hematology negative hematology ROS (+)   Anesthesia Other Findings   Reproductive/Obstetrics                             Anesthesia Physical Anesthesia Plan  ASA: II  Anesthesia Plan: General   Post-op Pain Management:    Induction: Intravenous  PONV Risk Score and Plan:   Airway Management Planned: LMA  Additional Equipment:   Intra-op Plan:   Post-operative Plan: Extubation in OR  Informed Consent: I have reviewed the patients History and Physical, chart, labs and discussed the procedure including the risks, benefits and alternatives for the proposed anesthesia with the patient or authorized representative who has indicated his/her understanding and acceptance.     Plan Discussed with: CRNA and Surgeon  Anesthesia Plan Comments:         Anesthesia Quick Evaluation

## 2017-10-27 NOTE — Discharge Instructions (Addendum)
Bladder Biopsy, Care After Refer to this sheet in the next few weeks. These instructions provide you with information about caring for yourself after your procedure. Your health care provider may also give you more specific instructions. Your treatment has been planned according to current medical practices, but problems sometimes occur. Call your health care provider if you have any problems or questions after your procedure. What can I expect after the procedure? After the procedure, it is common to have:  Mild pain in your bladder or kidney area during urination.  Minor burning during urination.  Small amounts of blood in your urine.  A sudden urge to urinate.  A need to urinate more often than usual.  Follow these instructions at home: Medicines  Take over-the-counter and prescription medicines only as told by your health care provider.  If you were prescribed an antibiotic medicine, take it as told by your health care provider. Do not stop taking the antibiotic even if you start to feel better. General instructions   Take a warm bath to relieve any burning sensations around your urethra.  Hold a warm, damp washcloth over the urethral area to ease pain.  Return to your normal activities as told by your health care provider. Ask your health care provider what activities are safe for you.  Do not drive for 24 hours if you received a medicine to help you relax (sedative) during your procedure. Ask your health care provider when it is safe for you to drive.  It is your responsibility to get the results of your procedure. Ask your health care provider or the department performing the procedure when your results will be ready.  Keep all follow-up visits as told by your health care provider. This is important. Contact a health care provider if:  You have a fever.  Your symptoms do not improve within 24 hours and you continue to have: ? Burning during urination. ? Increasing  amounts of blood in your urine. ? Pain during urination. ? An urgent need to urinate. ? A need to urinate more often than usual. Get help right away if:  You have a lot of bleeding or more bleeding.  You have severe pain.  You are unable to urinate.  You have bright red blood in your urine.  You are passing blood clots in your urine.  You have a fever.  You have swelling, redness, or pain in your legs.  You have difficulty breathing. This information is not intended to replace advice given to you by your health care provider. Make sure you discuss any questions you have with your health care provider. Document Released: 04/03/2015 Document Revised: 08/23/2015 Document Reviewed: 04/03/2015 Elsevier Interactive Patient Education  2018 [employer]. Cystoscopy, Care After Refer to this sheet in the next few weeks. These instructions provide you with information about caring for yourself after your procedure. Your health care provider may also give you more specific instructions. Your treatment has been planned according to current medical practices, but problems sometimes occur. Call your health care provider if you have any problems or questions after your procedure. What can I expect after the procedure? After the procedure, it is common to have:  Mild pain when you urinate. Pain should stop within a few minutes after you urinate. This may last for up to 1 week.  A small amount of blood in your urine for several days.  Feeling like you need to urinate but producing only a small amount of urine.  Follow these instructions at home:  Medicines  Take over-the-counter and prescription medicines only as told by your health care provider.  If you were prescribed an antibiotic medicine, take it as told by your health care provider. Do not stop taking the antibiotic even if you start to feel better. General instructions   Return to your normal activities as told by your health  care provider. Ask your health care provider what activities are safe for you.  Do not drive for 24 hours if you received a sedative.  Watch for any blood in your urine. If the amount of blood in your urine increases, call your health care provider.  Follow instructions from your health care provider about eating or drinking restrictions.  If a tissue sample was removed for testing (biopsy) during your procedure, it is your responsibility to get your test results. Ask your health care provider or the department performing the test when your results will be ready.  Drink enough fluid to keep your urine clear or pale yellow.  Keep all follow-up visits as told by your health care provider. This is important. Contact a health care provider if:  You have pain that gets worse or does not get better with medicine, especially pain when you urinate.  You have difficulty urinating. Get help right away if:  You have more blood in your urine.  You have blood clots in your urine.  You have abdominal pain.  You have a fever or chills.  You are unable to urinate. This information is not intended to replace advice given to you by your health care provider. Make sure you discuss any questions you have with your health care provider. Document Released: 10/04/2004 Document Revised: 08/23/2015 Document Reviewed: 02/01/2015 Elsevier Interactive Patient Education  2018 [employer]   1) The drugs that you were given will stay in your system until tomorrow so for the next 24 hours you should not:  A) Drive an automobile B) Make any legal decisions C) Drink any alcoholic beverage   2) You may resume regular meals tomorrow.  Today it is better to start with liquids and gradually work up to solid foods.  You may eat anything you prefer, but it is better to start with liquids, then soup and crackers, and gradually work up to solid  foods.   3) Please notify your doctor immediately if you have any unusual bleeding, trouble breathing, redness and pain at the surgery site, drainage, fever, or pain not relieved by medication.    4) Additional Instructions:        Please contact your physician with any problems or Same Day Surgery at [phone], Monday through Friday 6 am to 4 pm, or [place] [facility] number at [phone].

## 2017-10-27 NOTE — Transfer of Care (Signed)
Immediate Anesthesia Transfer of Care Note  Patient: Jennifer Hammond  Procedure(s) Performed: TRANSURETHRAL RESECTION OF BLADDER TUMOR WITH MITOMYCIN-C (N/A Bladder)  Patient Location: PACU  Anesthesia Type:General  Level of Consciousness: awake  Airway & Oxygen Therapy: Patient Spontanous Breathing  Post-op Assessment: Report given to RN  Post vital signs: Reviewed  Last Vitals:  Vitals Value Taken Time  BP 133/73 10/27/2017  2:57 PM  Temp 36.1 C 10/27/2017  2:56 PM  Pulse 83 10/27/2017  2:58 PM  Resp 12 10/27/2017  2:58 PM  SpO2 100 % 10/27/2017  2:58 PM  Vitals shown include unvalidated device data.  Last Pain:  Vitals:   10/27/17 1244  TempSrc: Tympanic  PainSc: 0-No pain         Complications: No apparent anesthesia complications

## 2017-10-27 NOTE — Anesthesia Procedure Notes (Signed)
Procedure Name: LMA Insertion Date/Time: 10/27/2017 1:42 PM Performed by: Aline Brochure, CRNA Pre-anesthesia Checklist: Patient identified, Emergency Drugs available, Suction available and Patient being monitored Patient Re-evaluated:Patient Re-evaluated prior to induction Oxygen Delivery Method: Circle system utilized Preoxygenation: Pre-oxygenation with 100% oxygen Induction Type: IV induction Ventilation: Mask ventilation without difficulty LMA: LMA inserted LMA Size: 3.5 Number of attempts: 2 Placement Confirmation: positive ETCO2 and breath sounds checked- equal and bilateral Tube secured with: Tape Dental Injury: Teeth and Oropharynx as per pre-operative assessment

## 2017-10-28 ENCOUNTER — Encounter: Payer: Self-pay | Admitting: Urology

## 2017-11-02 LAB — SURGICAL PATHOLOGY

## 2017-11-06 DIAGNOSIS — C44622 Squamous cell carcinoma of skin of right upper limb, including shoulder: Secondary | ICD-10-CM | POA: Diagnosis not present

## 2017-11-06 DIAGNOSIS — D485 Neoplasm of uncertain behavior of skin: Secondary | ICD-10-CM | POA: Diagnosis not present

## 2017-11-10 DIAGNOSIS — C674 Malignant neoplasm of posterior wall of bladder: Secondary | ICD-10-CM | POA: Diagnosis not present

## 2017-11-24 DIAGNOSIS — C679 Malignant neoplasm of bladder, unspecified: Secondary | ICD-10-CM | POA: Diagnosis not present

## 2017-11-24 DIAGNOSIS — C674 Malignant neoplasm of posterior wall of bladder: Secondary | ICD-10-CM | POA: Diagnosis not present

## 2017-11-25 DIAGNOSIS — C674 Malignant neoplasm of posterior wall of bladder: Secondary | ICD-10-CM | POA: Diagnosis not present

## 2017-12-01 ENCOUNTER — Encounter: Payer: Self-pay | Admitting: Women's Health

## 2017-12-01 ENCOUNTER — Ambulatory Visit (INDEPENDENT_AMBULATORY_CARE_PROVIDER_SITE_OTHER): Payer: BLUE CROSS/BLUE SHIELD | Admitting: Women's Health

## 2017-12-01 VITALS — BP 124/80 | Ht 64.0 in | Wt 122.0 lb

## 2017-12-01 DIAGNOSIS — Z01419 Encounter for gynecological examination (general) (routine) without abnormal findings: Secondary | ICD-10-CM

## 2017-12-01 NOTE — Patient Instructions (Signed)
Health Maintenance for Postmenopausal Women Menopause is a normal process in which your reproductive ability comes to an end. This process happens gradually over a span of months to years, usually between the ages of 22 and 9. Menopause is complete when you have missed 12 consecutive menstrual periods. It is important to talk with your health care provider about some of the most common conditions that affect postmenopausal women, such as heart disease, cancer, and bone loss (osteoporosis). Adopting a healthy lifestyle and getting preventive care can help to promote your health and wellness. Those actions can also lower your chances of developing some of these common conditions. What should I know about menopause? During menopause, you may experience a number of symptoms, such as:  Moderate-to-severe hot flashes.  Night sweats.  Decrease in sex drive.  Mood swings.  Headaches.  Tiredness.  Irritability.  Memory problems.  Insomnia.  Choosing to treat or not to treat menopausal changes is an individual decision that you make with your health care provider. What should I know about hormone replacement therapy and supplements? Hormone therapy products are effective for treating symptoms that are associated with menopause, such as hot flashes and night sweats. Hormone replacement carries certain risks, especially as you become older. If you are thinking about using estrogen or estrogen with progestin treatments, discuss the benefits and risks with your health care provider. What should I know about heart disease and stroke? Heart disease, heart attack, and stroke become more likely as you age. This may be due, in part, to the hormonal changes that your body experiences during menopause. These can affect how your body processes dietary fats, triglycerides, and cholesterol. Heart attack and stroke are both medical emergencies. There are many things that you can do to help prevent heart disease  and stroke:  Have your blood pressure checked at least every 1-2 years. High blood pressure causes heart disease and increases the risk of stroke.  If you are 53-22 years old, ask your health care provider if you should take aspirin to prevent a heart attack or a stroke.  Do not use any tobacco products, including cigarettes, chewing tobacco, or electronic cigarettes. If you need help quitting, ask your health care provider.  It is important to eat a healthy diet and maintain a healthy weight. ? Be sure to include plenty of vegetables, fruits, low-fat dairy products, and lean protein. ? Avoid eating foods that are high in solid fats, added sugars, or salt (sodium).  Get regular exercise. This is one of the most important things that you can do for your health. ? Try to exercise for at least 150 minutes each week. The type of exercise that you do should increase your heart rate and make you sweat. This is known as moderate-intensity exercise. ? Try to do strengthening exercises at least twice each week. Do these in addition to the moderate-intensity exercise.  Know your numbers.Ask your health care provider to check your cholesterol and your blood glucose. Continue to have your blood tested as directed by your health care provider.  What should I know about cancer screening? There are several types of cancer. Take the following steps to reduce your risk and to catch any cancer development as early as possible. Breast Cancer  Practice breast self-awareness. ? This means understanding how your breasts normally appear and feel. ? It also means doing regular breast self-exams. Let your health care provider know about any changes, no matter how small.  If you are 40  or older, have a clinician do a breast exam (clinical breast exam or CBE) every year. Depending on your age, family history, and medical history, it may be recommended that you also have a yearly breast X-ray (mammogram).  If you  have a family history of breast cancer, talk with your health care provider about genetic screening.  If you are at high risk for breast cancer, talk with your health care provider about having an MRI and a mammogram every year.  Breast cancer (BRCA) gene test is recommended for women who have family members with BRCA-related cancers. Results of the assessment will determine the need for genetic counseling and BRCA1 and for BRCA2 testing. BRCA-related cancers include these types: ? Breast. This occurs in males or females. ? Ovarian. ? Tubal. This may also be called fallopian tube cancer. ? Cancer of the abdominal or pelvic lining (peritoneal cancer). ? Prostate. ? Pancreatic.  Cervical, Uterine, and Ovarian Cancer Your health care provider may recommend that you be screened regularly for cancer of the pelvic organs. These include your ovaries, uterus, and vagina. This screening involves a pelvic exam, which includes checking for microscopic changes to the surface of your cervix (Pap test).  For women ages 21-65, health care providers may recommend a pelvic exam and a Pap test every three years. For women ages 79-65, they may recommend the Pap test and pelvic exam, combined with testing for human papilloma virus (HPV), every five years. Some types of HPV increase your risk of cervical cancer. Testing for HPV may also be done on women of any age who have unclear Pap test results.  Other health care providers may not recommend any screening for nonpregnant women who are considered low risk for pelvic cancer and have no symptoms. Ask your health care provider if a screening pelvic exam is right for you.  If you have had past treatment for cervical cancer or a condition that could lead to cancer, you need Pap tests and screening for cancer for at least 20 years after your treatment. If Pap tests have been discontinued for you, your risk factors (such as having a new sexual partner) need to be  reassessed to determine if you should start having screenings again. Some women have medical problems that increase the chance of getting cervical cancer. In these cases, your health care provider may recommend that you have screening and Pap tests more often.  If you have a family history of uterine cancer or ovarian cancer, talk with your health care provider about genetic screening.  If you have vaginal bleeding after reaching menopause, tell your health care provider.  There are currently no reliable tests available to screen for ovarian cancer.  Lung Cancer Lung cancer screening is recommended for adults 69-62 years old who are at high risk for lung cancer because of a history of smoking. A yearly low-dose CT scan of the lungs is recommended if you:  Currently smoke.  Have a history of at least 30 pack-years of smoking and you currently smoke or have quit within the past 15 years. A pack-year is smoking an average of one pack of cigarettes per day for one year.  Yearly screening should:  Continue until it has been 15 years since you quit.  Stop if you develop a health problem that would prevent you from having lung cancer treatment.  Colorectal Cancer  This type of cancer can be detected and can often be prevented.  Routine colorectal cancer screening usually begins at  age 42 and continues through age 45.  If you have risk factors for colon cancer, your health care provider may recommend that you be screened at an earlier age.  If you have a family history of colorectal cancer, talk with your health care provider about genetic screening.  Your health care provider may also recommend using home test kits to check for hidden blood in your stool.  A small camera at the end of a tube can be used to examine your colon directly (sigmoidoscopy or colonoscopy). This is done to check for the earliest forms of colorectal cancer.  Direct examination of the colon should be repeated every  5-10 years until age 71. However, if early forms of precancerous polyps or small growths are found or if you have a family history or genetic risk for colorectal cancer, you may need to be screened more often.  Skin Cancer  Check your skin from head to toe regularly.  Monitor any moles. Be sure to tell your health care provider: ? About any new moles or changes in moles, especially if there is a change in a mole's shape or color. ? If you have a mole that is larger than the size of a pencil eraser.  If any of your family members has a history of skin cancer, especially at a young age, talk with your health care provider about genetic screening.  Always use sunscreen. Apply sunscreen liberally and repeatedly throughout the day.  Whenever you are outside, protect yourself by wearing long sleeves, pants, a wide-brimmed hat, and sunglasses.  What should I know about osteoporosis? Osteoporosis is a condition in which bone destruction happens more quickly than new bone creation. After menopause, you may be at an increased risk for osteoporosis. To help prevent osteoporosis or the bone fractures that can happen because of osteoporosis, the following is recommended:  If you are 46-71 years old, get at least 1,000 mg of calcium and at least 600 mg of vitamin D per day.  If you are older than age 55 but younger than age 65, get at least 1,200 mg of calcium and at least 600 mg of vitamin D per day.  If you are older than age 54, get at least 1,200 mg of calcium and at least 800 mg of vitamin D per day.  Smoking and excessive alcohol intake increase the risk of osteoporosis. Eat foods that are rich in calcium and vitamin D, and do weight-bearing exercises several times each week as directed by your health care provider. What should I know about how menopause affects my mental health? Depression may occur at any age, but it is more common as you become older. Common symptoms of depression  include:  Low or sad mood.  Changes in sleep patterns.  Changes in appetite or eating patterns.  Feeling an overall lack of motivation or enjoyment of activities that you previously enjoyed.  Frequent crying spells.  Talk with your health care provider if you think that you are experiencing depression. What should I know about immunizations? It is important that you get and maintain your immunizations. These include:  Tetanus, diphtheria, and pertussis (Tdap) booster vaccine.  Influenza every year before the flu season begins.  Pneumonia vaccine.  Shingles vaccine.  Your health care provider may also recommend other immunizations. This information is not intended to replace advice given to you by your health care provider. Make sure you discuss any questions you have with your health care provider. Document Released: 05/09/2005  Document Revised: 10/05/2015 Document Reviewed: 12/19/2014 Elsevier Interactive Patient Education  2018 [employer]vier Inc.  

## 2017-12-01 NOTE — Progress Notes (Signed)
Jennifer Hammond 27-May-1955 [id number]    History:    Presents for annual exam.  1991 TAH with BSO for endometriosis on Estring.  Normal Pap and mammogram history.  09/2017 bladder cancer diagnosed currently being treated with chemo.  Father bladder cancer survivor.  2012 melanoma has annual skin checks.  2015- colonoscopy.  History of osteoporosis has completed 5 years of Fosamax primary care manages, reports DEXA stable.  Past medical history, past surgical history, family history and social history were all reviewed and documented in the EPIC chart.  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  Has 2 adopted children ages 7 and 86 and a grandson who is doing well.  Husband healthy and supportive.  Mother 95 no health problems, lives independently.  ROS:  A ROS was performed and pertinent positives and negatives are included.  Exam:  Vitals:   12/01/17 1623  BP: 124/80  Weight: 122 lb (55.3 kg)  Height: 5\' 4"  (1.626 m)   Body mass index is 20.94 kg/m.   General appearance:  Normal Thyroid:  Symmetrical, normal in size, without palpable masses or nodularity. Respiratory  Auscultation:  Clear without wheezing or rhonchi Cardiovascular  Auscultation:  Regular rate, without rubs, murmurs or gallops  Edema/varicosities:  Not grossly evident Abdominal  Soft,nontender, without masses, guarding or rebound.  Liver/spleen:  No organomegaly noted  Hernia:  None appreciated  Skin  Inspection:  Grossly normal   Breasts: Examined lying and sitting.     Right: Without masses, retractions, discharge or axillary adenopathy.     Left: Without masses, retractions, discharge or axillary adenopathy. Gentitourinary   Inguinal/mons:  Normal without inguinal adenopathy  External genitalia:  Normal  BUS/Urethra/Skene's glands:  Normal  Vagina:  Normal  Cervix: And uterus absent Estring removed and discarded adnexa/parametria:     Rt: Without masses or tenderness.   Lt: Without masses or tenderness.  Anus and  perineum: Normal  Digital rectal exam: Normal sphincter tone without palpated masses or tenderness  Assessment/Plan:  62 y.o. MWF G0 +2 adopted for annual exam.     91 TAH with BSO for endometriosis 09/2017 bladder cancer receiving chemo History of osteoporosis without fractures primary care manages 2012 melanoma-dermatologist manages Primary care-labs  Plan: Discussed stopping Estring during chemo.  Will review with urologist resume if dryness becomes too intense.  Rare intercourse and was told not to have intercourse during chemo.  SBE's, continue annual screening mammogram, calcium rich foods, vitamin D supplement encouraged.  Continue regular exercise, home safety, fall prevention and importance of balance type exercise such as yoga encouraged.  Continue annual dermatology appointments, recent squamous skin cancer hand.     Huel Cote [facility], 5:25 PM 12/01/2017

## 2017-12-02 DIAGNOSIS — C674 Malignant neoplasm of posterior wall of bladder: Secondary | ICD-10-CM | POA: Diagnosis not present

## 2017-12-03 ENCOUNTER — Other Ambulatory Visit: Payer: Self-pay | Admitting: Women's Health

## 2017-12-03 DIAGNOSIS — Z79890 Hormone replacement therapy (postmenopausal): Secondary | ICD-10-CM

## 2017-12-03 NOTE — Telephone Encounter (Signed)
She is currently being treated for bladder cancer so we are waiting until after treatment, she will call if wants to continue after checking with urologist so for now will hold.

## 2017-12-03 NOTE — Telephone Encounter (Signed)
Jennifer Hammond patient was here for annual on 12/01/17 was she suppose to have Rx?

## 2017-12-09 DIAGNOSIS — C674 Malignant neoplasm of posterior wall of bladder: Secondary | ICD-10-CM | POA: Diagnosis not present

## 2017-12-15 DIAGNOSIS — Z85828 Personal history of other malignant neoplasm of skin: Secondary | ICD-10-CM | POA: Diagnosis not present

## 2017-12-15 DIAGNOSIS — D2262 Melanocytic nevi of left upper limb, including shoulder: Secondary | ICD-10-CM | POA: Diagnosis not present

## 2017-12-15 DIAGNOSIS — D485 Neoplasm of uncertain behavior of skin: Secondary | ICD-10-CM | POA: Diagnosis not present

## 2017-12-15 DIAGNOSIS — Z85820 Personal history of malignant melanoma of skin: Secondary | ICD-10-CM | POA: Diagnosis not present

## 2017-12-15 DIAGNOSIS — L578 Other skin changes due to chronic exposure to nonionizing radiation: Secondary | ICD-10-CM | POA: Diagnosis not present

## 2017-12-15 DIAGNOSIS — D2272 Melanocytic nevi of left lower limb, including hip: Secondary | ICD-10-CM | POA: Diagnosis not present

## 2017-12-16 DIAGNOSIS — Z79899 Other long term (current) drug therapy: Secondary | ICD-10-CM | POA: Diagnosis not present

## 2017-12-16 DIAGNOSIS — C674 Malignant neoplasm of posterior wall of bladder: Secondary | ICD-10-CM | POA: Diagnosis not present

## 2017-12-17 DIAGNOSIS — Z79899 Other long term (current) drug therapy: Secondary | ICD-10-CM | POA: Diagnosis not present

## 2017-12-17 DIAGNOSIS — Z8551 Personal history of malignant neoplasm of bladder: Secondary | ICD-10-CM | POA: Diagnosis not present

## 2018-01-27 DIAGNOSIS — Z8551 Personal history of malignant neoplasm of bladder: Secondary | ICD-10-CM | POA: Diagnosis not present

## 2018-01-30 DIAGNOSIS — C674 Malignant neoplasm of posterior wall of bladder: Secondary | ICD-10-CM | POA: Diagnosis not present

## 2018-01-30 DIAGNOSIS — R3129 Other microscopic hematuria: Secondary | ICD-10-CM | POA: Diagnosis not present

## 2018-01-30 DIAGNOSIS — C679 Malignant neoplasm of bladder, unspecified: Secondary | ICD-10-CM | POA: Diagnosis not present

## 2018-02-03 DIAGNOSIS — R3129 Other microscopic hematuria: Secondary | ICD-10-CM | POA: Diagnosis not present

## 2018-02-03 DIAGNOSIS — C674 Malignant neoplasm of posterior wall of bladder: Secondary | ICD-10-CM | POA: Diagnosis not present

## 2018-02-10 DIAGNOSIS — C674 Malignant neoplasm of posterior wall of bladder: Secondary | ICD-10-CM | POA: Diagnosis not present

## 2018-02-17 DIAGNOSIS — C674 Malignant neoplasm of posterior wall of bladder: Secondary | ICD-10-CM | POA: Diagnosis not present

## 2018-02-22 DIAGNOSIS — C674 Malignant neoplasm of posterior wall of bladder: Secondary | ICD-10-CM | POA: Diagnosis not present

## 2018-02-22 DIAGNOSIS — Z79899 Other long term (current) drug therapy: Secondary | ICD-10-CM | POA: Diagnosis not present

## 2018-02-24 DIAGNOSIS — Z79899 Other long term (current) drug therapy: Secondary | ICD-10-CM | POA: Diagnosis not present

## 2018-04-08 NOTE — H&P (Signed)
Jennifer Hammond, CUYLER MEDICAL RECORD [id number] ACCOUNT [id number] DATE OF BIRTH:13-Feb-1956 FACILITY: [facility] LOCATION:  PHYSICIAN:MICHAEL R. WOLFF, MD  HISTORY AND PHYSICAL  DATE OF ADMISSION:  04/20/2018  CHIEF COMPLAINT:  History of bladder cancer.  HISTORY OF PRESENT ILLNESS:  The patient is a 63 year old Caucasian female with a history of high grade superficial urothelial carcinoma of the bladder who underwent transurethral resection in July 2019.  She has been treated with intravesical mitomycin  treatments since 08/28.  She comes in now for cystoscopy and bladder biopsy.  PAST MEDICAL HISTORY:    ALLERGIES:  No drug allergies.  CURRENT MEDICATIONS:  Estring, fluoxetine, vitamin D3, calcium, super B complex, vitamin B12, Centrum Silver, Zyrtec, alprazolam, Flonase, Gaviscon, Advil and rizatriptan.   PAST SURGICAL HISTORY: 1.  Hysterectomy in 1991 due to endometriosis. 2.  Eyelid surgery 2011. 3.  Left hip replacement in 2016. 4.  Repair of a fractured left patella in 2018.  PAST AND CURRENT MEDICAL CONDITIONS:   1.  GERD. 2.  Migraine headaches. 3.  Osteoporosis. 4.  Hyperlipidemia. 5.  History of basal cell carcinoma and melanoma of the chest wall, status post removal in 2012 and 2019.  REVIEW OF SYSTEMS:  The patient has a history of allergic rhinitis and decreased visual acuity.  She denies chest pain, shortness of breath, diabetes, stroke or heart disease.  FAMILY HISTORY:  Father died at age 75 and had a history of bladder cancer prior to his death, but died of a stroke.  Mother is living, age 75 with history of kidney stones.  PHYSICAL EXAMINATION: GENERAL:  Well-nourished white female in no acute distress. VITAL SIGNS:  Weight was 126, height 5 feet 3 inches.  HEENT:  Sclerae were clear.  Pupils are equally round, reactive to light and accommodation.  Extraocular movements were intact. NECK:  No palpable cervical adenopathy. PULMONARY:  Lungs clear to  auscultation. CARDIOVASCULAR:  Regular rhythm and rate without audible murmurs. ABDOMEN:  Soft, nontender abdomen. GENITOURINARY AND RECTAL:  Deferred NEUROMUSCULAR:  Alert and oriented x3.  IMPRESSION:  History of high grade superficial urothelial carcinoma status post mitomycin intravesical therapy.  PLAN:  Cystoscopy with bladder biopsy.  TN/NUANCE  D:04/08/2018 T:04/08/2018 JOB:[id number]

## 2018-04-14 ENCOUNTER — Other Ambulatory Visit: Payer: Self-pay

## 2018-04-14 ENCOUNTER — Encounter
Admission: RE | Admit: 2018-04-14 | Discharge: 2018-04-14 | Disposition: A | Discharge status: Home / Self Care | Payer: BLUE CROSS/BLUE SHIELD | Source: Ambulatory Visit | Attending: Urology | Admitting: Urology

## 2018-04-14 HISTORY — DX: Cardiac murmur, unspecified: R01.1

## 2018-04-14 NOTE — Patient Instructions (Signed)
Your procedure is scheduled on: 04-20-18 Report to Same Day Surgery 2nd floor medical mall [facility] Entrance-take elevator on left to 2nd floor.  Check in with surgery information desk.) To find out your arrival time please call [phone] between 1PM - 3PM on 04-19-18  Remember: Instructions that are not followed completely may result in serious medical risk, up to and including death, or upon the discretion of your surgeon and anesthesiologist your surgery may need to be rescheduled.    _x___ 1. Do not eat food after midnight the night before your procedure. You may drink clear liquids up to 2 hours before you are scheduled to arrive at the hospital for your procedure.  Do not drink clear liquids within 2 hours of your scheduled arrival to the hospital.  Clear liquids include  --Water or Apple juice without pulp  --Clear carbohydrate beverage such as ClearFast or Gatorade  --Black Coffee or Clear Tea (No milk, no creamers, do not add anything to the coffee or Tea   ____Ensure clear carbohydrate drink on the way to the hospital for bariatric patients  ____Ensure clear carbohydrate drink 3 hours before surgery for Dr Dwyane Luo patients if physician instructed.   No gum chewing or hard candies.     __x__ 2. No Alcohol for 24 hours before or after surgery.   __x__3. No Smoking or e-cigarettes for 24 prior to surgery.  Do not use any chewable tobacco products for at least 6 hour prior to surgery   ____  4. Bring all medications with you on the day of surgery if instructed.    __x__ 5. Notify your doctor if there is any change in your medical condition     (cold, fever, infections).    x___6. On the morning of surgery brush your teeth with toothpaste and water.  You may rinse your mouth with mouth wash if you wish.  Do not swallow any toothpaste or mouthwash.   Do not wear jewelry, make-up, hairpins, clips or nail polish.  Do not wear lotions, powders, or perfumes. You may wear  deodorant.  Do not shave 48 hours prior to surgery. Men may shave face and neck.  Do not bring valuables to the hospital.    [facility] is not responsible for any belongings or valuables.               Contacts, dentures or bridgework may not be worn into surgery.  Leave your suitcase in the car. After surgery it may be brought to your room.  For patients admitted to the hospital, discharge time is determined by your  treatment team.  _  Patients discharged the day of surgery will not be allowed to drive home.  You will need someone to drive you home and stay with you the night of your procedure.    Please read over the following fact sheets that you were given:   [facility] Preparing for Surgery   _x___ Take anti-hypertensive listed below, cardiac, seizure, asthma,     anti-reflux and psychiatric medicines. These include:  1. YOU MAY TAKE XANAX DAY OF SURGERY IF NEEDED WITH A SMALL SIP OF WATER  2.  3.  4.  5.  6.  ____Fleets enema or Magnesium Citrate as directed.   ____ Use CHG Soap or sage wipes as directed on instruction sheet   ____ Use inhalers on the day of surgery and bring to hospital day of surgery  ____ Stop Metformin and Janumet 2 days prior  to surgery.    ____ Take 1/2 of usual insulin dose the night before surgery and none on the morning     surgery.   ____ Follow recommendations from Cardiologist, Pulmonologist or PCP regarding stopping Aspirin, Coumadin, Plavix ,Eliquis, Effient, or Pradaxa, and Pletal.  X____Stop Anti-inflammatories such as Advil, Aleve, Ibuprofen, Motrin, Naproxen, Naprosyn, Goodies powders or aspirin products. OK to take Tylenol    _x___ Stop supplements until after surgery-STOP VITAMIN C NOW   ____ Bring C-Pap to the hospital.

## 2018-04-20 ENCOUNTER — Ambulatory Visit: Payer: BLUE CROSS/BLUE SHIELD | Admitting: Anesthesiology

## 2018-04-20 ENCOUNTER — Encounter
Admission: RE | Disposition: A | Discharge status: Home / Self Care | Payer: Self-pay | Source: Home / Self Care | Attending: Urology

## 2018-04-20 ENCOUNTER — Encounter: Payer: Self-pay | Admitting: Emergency Medicine

## 2018-04-20 ENCOUNTER — Ambulatory Visit
Admission: RE | Admit: 2018-04-20 | Discharge: 2018-04-20 | Disposition: A | Discharge status: Home / Self Care | Payer: BLUE CROSS/BLUE SHIELD | Attending: Urology | Admitting: Urology

## 2018-04-20 ENCOUNTER — Other Ambulatory Visit: Payer: Self-pay

## 2018-04-20 DIAGNOSIS — K219 Gastro-esophageal reflux disease without esophagitis: Secondary | ICD-10-CM | POA: Insufficient documentation

## 2018-04-20 DIAGNOSIS — Z79890 Hormone replacement therapy (postmenopausal): Secondary | ICD-10-CM | POA: Diagnosis not present

## 2018-04-20 DIAGNOSIS — Z96642 Presence of left artificial hip joint: Secondary | ICD-10-CM | POA: Diagnosis not present

## 2018-04-20 DIAGNOSIS — C674 Malignant neoplasm of posterior wall of bladder: Secondary | ICD-10-CM | POA: Diagnosis not present

## 2018-04-20 DIAGNOSIS — Z7951 Long term (current) use of inhaled steroids: Secondary | ICD-10-CM | POA: Diagnosis not present

## 2018-04-20 DIAGNOSIS — Z8551 Personal history of malignant neoplasm of bladder: Secondary | ICD-10-CM | POA: Diagnosis not present

## 2018-04-20 DIAGNOSIS — Z79899 Other long term (current) drug therapy: Secondary | ICD-10-CM | POA: Diagnosis not present

## 2018-04-20 DIAGNOSIS — G43909 Migraine, unspecified, not intractable, without status migrainosus: Secondary | ICD-10-CM | POA: Diagnosis not present

## 2018-04-20 DIAGNOSIS — M810 Age-related osteoporosis without current pathological fracture: Secondary | ICD-10-CM | POA: Diagnosis not present

## 2018-04-20 DIAGNOSIS — Z85820 Personal history of malignant melanoma of skin: Secondary | ICD-10-CM | POA: Diagnosis not present

## 2018-04-20 DIAGNOSIS — Z8052 Family history of malignant neoplasm of bladder: Secondary | ICD-10-CM | POA: Insufficient documentation

## 2018-04-20 DIAGNOSIS — N3289 Other specified disorders of bladder: Secondary | ICD-10-CM | POA: Insufficient documentation

## 2018-04-20 DIAGNOSIS — C679 Malignant neoplasm of bladder, unspecified: Secondary | ICD-10-CM | POA: Diagnosis not present

## 2018-04-20 HISTORY — PX: CYSTOSCOPY WITH BIOPSY: SHX5122

## 2018-04-20 SURGERY — CYSTOSCOPY, WITH BIOPSY
Anesthesia: General

## 2018-04-20 MED ORDER — ONDANSETRON HCL 4 MG/2ML IJ SOLN
INTRAMUSCULAR | Status: DC | PRN
  Administered 2018-04-20: 4 mg via INTRAVENOUS

## 2018-04-20 MED ORDER — LACTATED RINGERS IV SOLN
INTRAVENOUS | Status: DC
  Administered 2018-04-20 (×2): via INTRAVENOUS

## 2018-04-20 MED ORDER — DEXAMETHASONE SODIUM PHOSPHATE 10 MG/ML IJ SOLN
INTRAMUSCULAR | Status: DC | PRN
  Administered 2018-04-20: 10 mg via INTRAVENOUS

## 2018-04-20 MED ORDER — OXYCODONE HCL 5 MG/5ML PO SOLN: 5.0000 mg | Freq: Once | ORAL | Status: DC | PRN

## 2018-04-20 MED ORDER — PHENYLEPHRINE HCL 10 MG/ML IJ SOLN
INTRAMUSCULAR | Status: DC | PRN
  Administered 2018-04-20 (×5): 100 ug via INTRAVENOUS

## 2018-04-20 MED ORDER — PROPOFOL 10 MG/ML IV BOLUS
INTRAVENOUS | Status: AC
  Filled 2018-04-20: qty 20

## 2018-04-20 MED ORDER — PROPOFOL 10 MG/ML IV BOLUS
INTRAVENOUS | Status: DC | PRN
  Administered 2018-04-20: 150 mg via INTRAVENOUS

## 2018-04-20 MED ORDER — LIDOCAINE HCL (CARDIAC) PF 100 MG/5ML IV SOSY
PREFILLED_SYRINGE | INTRAVENOUS | Status: DC | PRN
  Administered 2018-04-20: 100 mg via INTRAVENOUS

## 2018-04-20 MED ORDER — FAMOTIDINE 20 MG PO TABS
ORAL_TABLET | ORAL | Status: AC
  Administered 2018-04-20: 20 mg via ORAL
  Filled 2018-04-20: qty 1

## 2018-04-20 MED ORDER — PHENYLEPHRINE HCL 10 MG/ML IJ SOLN
INTRAMUSCULAR | Status: AC
  Filled 2018-04-20: qty 1

## 2018-04-20 MED ORDER — CEFAZOLIN SODIUM-DEXTROSE 1-4 GM/50ML-% IV SOLN
INTRAVENOUS | Status: AC
  Filled 2018-04-20: qty 50

## 2018-04-20 MED ORDER — GLYCOPYRROLATE 0.2 MG/ML IJ SOLN
INTRAMUSCULAR | Status: AC
  Filled 2018-04-20: qty 1

## 2018-04-20 MED ORDER — LIDOCAINE HCL URETHRAL/MUCOSAL 2 % EX GEL
CUTANEOUS | Status: AC
  Filled 2018-04-20: qty 10

## 2018-04-20 MED ORDER — FAMOTIDINE 20 MG PO TABS
20.0000 mg | ORAL_TABLET | Freq: Once | ORAL | Status: AC
  Administered 2018-04-20: 20 mg via ORAL

## 2018-04-20 MED ORDER — MEPERIDINE HCL 50 MG/ML IJ SOLN: 6.2500 mg | INTRAMUSCULAR | Status: DC | PRN

## 2018-04-20 MED ORDER — CIPROFLOXACIN HCL 500 MG PO TABS: 500.0000 mg | ORAL_TABLET | Freq: Two times a day (BID) | ORAL | 0 refills | Status: DC

## 2018-04-20 MED ORDER — FENTANYL CITRATE (PF) 100 MCG/2ML IJ SOLN: 25.0000 ug | INTRAMUSCULAR | Status: DC | PRN

## 2018-04-20 MED ORDER — BELLADONNA ALKALOIDS-OPIUM 16.2-60 MG RE SUPP
RECTAL | Status: DC | PRN
  Administered 2018-04-20: 1 via RECTAL

## 2018-04-20 MED ORDER — OXYCODONE HCL 5 MG PO TABS: 5.0000 mg | ORAL_TABLET | Freq: Once | ORAL | Status: DC | PRN

## 2018-04-20 MED ORDER — FENTANYL CITRATE (PF) 100 MCG/2ML IJ SOLN
INTRAMUSCULAR | Status: AC
  Filled 2018-04-20: qty 2

## 2018-04-20 MED ORDER — ONDANSETRON HCL 4 MG/2ML IJ SOLN
INTRAMUSCULAR | Status: AC
  Filled 2018-04-20: qty 2

## 2018-04-20 MED ORDER — LIDOCAINE HCL URETHRAL/MUCOSAL 2 % EX GEL
CUTANEOUS | Status: DC | PRN
  Administered 2018-04-20: 1

## 2018-04-20 MED ORDER — BELLADONNA ALKALOIDS-OPIUM 16.2-60 MG RE SUPP
RECTAL | Status: AC
  Filled 2018-04-20: qty 1

## 2018-04-20 MED ORDER — URIBEL 118 MG PO CAPS: 1.0000 | ORAL_CAPSULE | Freq: Four times a day (QID) | ORAL | 3 refills | Status: DC | PRN

## 2018-04-20 MED ORDER — GLYCOPYRROLATE 0.2 MG/ML IJ SOLN
INTRAMUSCULAR | Status: DC | PRN
  Administered 2018-04-20: 0.2 mg via INTRAVENOUS

## 2018-04-20 MED ORDER — CEFAZOLIN SODIUM-DEXTROSE 1-4 GM/50ML-% IV SOLN
1.0000 g | Freq: Once | INTRAVENOUS | Status: AC
  Administered 2018-04-20: 1 g via INTRAVENOUS

## 2018-04-20 MED ORDER — FENTANYL CITRATE (PF) 100 MCG/2ML IJ SOLN
INTRAMUSCULAR | Status: DC | PRN
  Administered 2018-04-20 (×2): 25 ug via INTRAVENOUS

## 2018-04-20 MED ORDER — PROMETHAZINE HCL 25 MG/ML IJ SOLN: 6.2500 mg | INTRAMUSCULAR | Status: DC | PRN

## 2018-04-20 MED ORDER — DEXAMETHASONE SODIUM PHOSPHATE 10 MG/ML IJ SOLN
INTRAMUSCULAR | Status: AC
  Filled 2018-04-20: qty 1

## 2018-04-20 MED ORDER — MIDAZOLAM HCL 2 MG/2ML IJ SOLN
INTRAMUSCULAR | Status: AC
  Filled 2018-04-20: qty 2

## 2018-04-20 SURGICAL SUPPLY — 15 items
BAG DRAIN CYSTO-URO LG1000N (MISCELLANEOUS) ×2 IMPLANT
DRSG TELFA 4X3 1S NADH ST (GAUZE/BANDAGES/DRESSINGS) ×2 IMPLANT
ELECT REM PT RETURN 9FT ADLT (ELECTROSURGICAL) ×2
ELECTRODE REM PT RTRN 9FT ADLT (ELECTROSURGICAL) ×1 IMPLANT
GLOVE BIO SURGEON STRL SZ7.5 (GLOVE) ×2 IMPLANT
GOWN STRL REUS W/ TWL LRG LVL3 (GOWN DISPOSABLE) ×1 IMPLANT
GOWN STRL REUS W/ TWL XL LVL3 (GOWN DISPOSABLE) ×1 IMPLANT
GOWN STRL REUS W/TWL LRG LVL3 (GOWN DISPOSABLE) ×2
GOWN STRL REUS W/TWL XL LVL3 (GOWN DISPOSABLE) ×2
KIT TURNOVER CYSTO (KITS) ×2 IMPLANT
NEEDLE HYPO 22GX1.5 SAFETY (NEEDLE) ×2 IMPLANT
PACK CYSTO AR (MISCELLANEOUS) ×2 IMPLANT
SET IRRIG Y TYPE TUR BLADDER L (SET/KITS/TRAYS/PACK) ×2 IMPLANT
WATER STERILE IRR 1000ML POUR (IV SOLUTION) ×2 IMPLANT
WATER STERILE IRR 3000ML UROMA (IV SOLUTION) ×2 IMPLANT

## 2018-04-20 NOTE — Op Note (Signed)
Preoperative diagnosis: History of bladder cancer  Postoperative diagnosis: Same  Procedure: Cystoscopy with bladder biopsy  Surgeon: Otelia Limes. Yves Dill MD  Anesthesia: General  Indications:See the history and physical. After informed consent the above procedure(s) were requested     Technique and findings: After adequate general anesthesia had been obtained the patient was placed into dorsal lithotomy position and the perineum was prepped and draped in the usual fashion.  The cystoscope sheath was advanced into the bladder with the obturator in place.  The cystoscope was then coupled to the camera and placed into the sheath.  The bladder was thoroughly inspected.  Both ureteral orifices  were identified and had clear efflux.  The patient had an area of necrotic tumor of the posterior wall.  There is also a 1 to 2 mm erythematous spot just medial to the right ureteral orifice.  The lesion near the orifice was biopsied with the cold cup biopsies and biopsy site fulgurated with a Bugbee electrode.  The necrotic tumor area was biopsied with multiple fragments.  Adjacent areas were biopsied as well and submitted in the same specimen vial.  This biopsy site was fulgurated with the Bugbee electrode.  Adequate hemostasis had been obtained the bladder was drained and cystoscope was removed.  An cc of viscous Xylocaine was instilled within the urethra and bladder.  B&O suppository was placed.  Procedure was then terminated and patient transferred to the recovery room in stable condition.

## 2018-04-20 NOTE — Anesthesia Preprocedure Evaluation (Signed)
Anesthesia Evaluation  Patient identified by MRN, date of birth, ID band Patient awake    Reviewed: Allergy & Precautions, NPO status , Patient's Chart, lab work & pertinent test results  History of Anesthesia Complications (+) PONV and history of anesthetic complications  Airway Mallampati: II  TM Distance: >3 FB Neck ROM: Full    Dental no notable dental hx.    Pulmonary neg pulmonary ROS, neg sleep apnea, neg COPD,    breath sounds clear to auscultation- rhonchi (-) wheezing      Cardiovascular Exercise Tolerance: Good (-) hypertension(-) CAD, (-) Past MI, (-) Cardiac Stents and (-) CABG Valvular problems/murmurs: MVP.  Rhythm:Regular Rate:Normal - Systolic murmurs and - Diastolic murmurs    Neuro/Psych  Headaches, neg Seizures Anxiety    GI/Hepatic Neg liver ROS, GERD  ,  Endo/Other  negative endocrine ROSneg diabetes  Renal/GU negative Renal ROS     Musculoskeletal  (+) Arthritis ,   Abdominal (+) - obese,   Peds  Hematology negative hematology ROS (+)   Anesthesia Other Findings Past Medical History: No date: Anxiety 2012: Cancer ([place])     Comment:  MELANOMA No date: Complication of anesthesia No date: Endometriosis No date: GERD (gastroesophageal reflux disease) No date: Headache     Comment:  migraines No date: Heart murmur     Comment:  asymptomatic No date: MVP (mitral valve prolapse)     Comment:  ASYMPTOMATIC-NO MEDS-DOES NOT SEE CARDIOLOGIST-LAST ECHO              IN 2016 No date: Osteoarthritis     Comment:  hands and neck No date: Osteoporosis No date: PONV (postoperative nausea and vomiting)     Comment:  nausea only   Reproductive/Obstetrics                             Anesthesia Physical Anesthesia Plan  ASA: II  Anesthesia Plan: General   Post-op Pain Management:    Induction: Intravenous  PONV Risk Score and Plan: 3 and Dexamethasone, Midazolam and  Ondansetron  Airway Management Planned: LMA  Additional Equipment:   Intra-op Plan:   Post-operative Plan:   Informed Consent: I have reviewed the patients History and Physical, chart, labs and discussed the procedure including the risks, benefits and alternatives for the proposed anesthesia with the patient or authorized representative who has indicated his/her understanding and acceptance.     Dental advisory given  Plan Discussed with: CRNA and Anesthesiologist  Anesthesia Plan Comments:         Anesthesia Quick Evaluation

## 2018-04-20 NOTE — H&P (Signed)
Date of Initial H&P: 04/08/18  History reviewed, patient examined, no change in status, stable for surgery.

## 2018-04-20 NOTE — Anesthesia Postprocedure Evaluation (Signed)
Anesthesia Post Note  Patient: Jennifer Hammond  Procedure(s) Performed: CYSTOSCOPY WITH BLADDER BIOPSY (N/A )  Patient location during evaluation: PACU Anesthesia Type: General Level of consciousness: awake and alert and oriented Pain management: pain level controlled Vital Signs Assessment: post-procedure vital signs reviewed and stable Respiratory status: spontaneous breathing, nonlabored ventilation and respiratory function stable Cardiovascular status: blood pressure returned to baseline and stable Postop Assessment: no signs of nausea or vomiting Anesthetic complications: no     Last Vitals:  Vitals:   04/20/18 1433 04/20/18 1443  BP: 129/70 104/78  Pulse: 76 89  Resp: 13 17  Temp: 36.6 C (!) 36.1 C  SpO2: 98% 100%    Last Pain:  Vitals:   04/20/18 1443  TempSrc: Temporal  PainSc: 0-No pain                 Omnia Astraea Gaughran

## 2018-04-20 NOTE — Anesthesia Post-op Follow-up Note (Signed)
Anesthesia QCDR form completed.        

## 2018-04-20 NOTE — Anesthesia Procedure Notes (Signed)
Procedure Name: LMA Insertion Date/Time: 04/20/2018 1:08 PM Performed by: Rudean Hitt, CRNA Pre-anesthesia Checklist: Patient identified, Patient being monitored, Timeout performed, Emergency Drugs available and Suction available Patient Re-evaluated:Patient Re-evaluated prior to induction Oxygen Delivery Method: Circle system utilized Preoxygenation: Pre-oxygenation with 100% oxygen Induction Type: IV induction Ventilation: Mask ventilation without difficulty LMA: LMA inserted LMA Size: 4.0 Tube type: Oral Number of attempts: 1 Placement Confirmation: positive ETCO2 and breath sounds checked- equal and bilateral Tube secured with: Tape Dental Injury: Teeth and Oropharynx as per pre-operative assessment

## 2018-04-20 NOTE — Transfer of Care (Signed)
Immediate Anesthesia Transfer of Care Note  Patient: Jennifer Hammond  Procedure(s) Performed: CYSTOSCOPY WITH BLADDER BIOPSY (N/A )  Patient Location: PACU  Anesthesia Type:General  Level of Consciousness: drowsy  Airway & Oxygen Therapy: Patient Spontanous Breathing and Patient connected to face mask oxygen  Post-op Assessment: Report given to RN and Post -op Vital signs reviewed and stable  Post vital signs: Reviewed and stable  Last Vitals:  Vitals Value Taken Time  BP 125/73 04/20/2018  1:49 PM  Temp 36.4 C 04/20/2018  1:49 PM  Pulse 93 04/20/2018  1:56 PM  Resp 12 04/20/2018  1:56 PM  SpO2 100 % 04/20/2018  1:56 PM  Vitals shown include unvalidated device data.  Last Pain:  Vitals:   04/20/18 1349  TempSrc:   PainSc: Asleep         Complications: No apparent anesthesia complications

## 2018-04-20 NOTE — Discharge Instructions (Signed)
Bladder Biopsy, Care After Refer to this sheet in the next few weeks. These instructions provide you with information about caring for yourself after your procedure. Your health care provider may also give you more specific instructions. Your treatment has been planned according to current medical practices, but problems sometimes occur. Call your health care provider if you have any problems or questions after your procedure. What can I expect after the procedure? After the procedure, it is common to have:  Mild pain in your bladder or kidney area during urination.  Minor burning during urination.  Small amounts of blood in your urine.  A sudden urge to urinate.  A need to urinate more often than usual. Follow these instructions at home: Medicines  Take over-the-counter and prescription medicines only as told by your health care provider.  If you were prescribed an antibiotic medicine, take it as told by your health care provider. Do not stop taking the antibiotic even if you start to feel better. General instructions   Take a warm bath to relieve any burning sensations around your urethra.  Hold a warm, damp washcloth over the urethral area to ease pain.  Return to your normal activities as told by your health care provider. Ask your health care provider what activities are safe for you.  Do not drive for 24 hours if you received a medicine to help you relax (sedative) during your procedure. Ask your health care provider when it is safe for you to drive.  It is your responsibility to get the results of your procedure. Ask your health care provider or the department performing the procedure when your results will be ready.  Keep all follow-up visits as told by your health care provider. This is important. Contact a health care provider if:  You have a fever.  Your symptoms do not improve within 24 hours and you continue to have: ? Burning during urination. ? Increasing  amounts of blood in your urine. ? Pain during urination. ? An urgent need to urinate. ? A need to urinate more often than usual. Get help right away if:  You have a lot of bleeding or more bleeding.  You have severe pain.  You are unable to urinate.  You have bright red blood in your urine.  You are passing blood clots in your urine.  You have a fever.  You have swelling, redness, or pain in your legs.  You have difficulty breathing. This information is not intended to replace advice given to you by your health care provider. Make sure you discuss any questions you have with your health care provider. Document Released: 04/03/2015 Document Revised: 08/23/2015 Document Reviewed: 04/03/2015 Elsevier Interactive Patient Education  2019 [employer] Bladder Biopsy  A bladder biopsy is a procedure to remove a small sample of tissue from the bladder. The procedure is done so that the tissue can be examined under a microscope. You may have a bladder biopsy to diagnose or rule out bladder cancer. During your bladder biopsy, your health care provider may insert a long, thin scope with a tiny lighted camera (cystoscope) into the tube that leads from your bladder to the outside of your body (urethra) and move it into your bladder. The cystoscope will allow your health care provider to examine the lining of the urethra and bladder and remove the tissue sample. If your health provider also needs to examine the tubes that connect the kidneys to the bladder (ureters), a longer tube (ureteroscope) may be  used. Tell a health care provider about:  Any allergies you have.  All medicines you are taking, including vitamins, herbs, eye drops, creams, and over-the-counter medicines.  Any problems you or family members have had with anesthetic medicines.  Any blood disorders you have.  Any surgeries you have had.  Any medical conditions you have.  Whether you are pregnant or may be  pregnant. What are the risks? Generally, this is a safe procedure. However, problems may occur, including:  Unusual bleeding.  Infection, especially a urinary tract infection (UTI).  Allergic reactions to medicines.  Damage to the surrounding organs, including the urethra, bladder, or ureters.  Abdominal pain.  Burning or pain during urination.  Narrowing of the urethra due to scar tissue.  Difficulty urinating due to swelling. What happens before the procedure?  You may be given antibiotic medicine to help prevent infection.  Ask your health care provider about: ? Changing or stopping your regular medicines. This is especially important if you are taking diabetes medicines or blood thinners. ? Taking medicines such as aspirin and ibuprofen. These medicines can thin your blood. Do not take these medicines before your procedure if your health care provider instructs you not to.  Follow instructions from your health care provider about eating and drinking restrictions.  You may be asked to drink plenty of fluids.  You may be asked to urinate right before the procedure. You may have a urine sample taken for UTI testing.  Plan to have someone take you home from the hospital or clinic.  If you will be going home right after the procedure, plan to have someone with you for 24 hours. What happens during the procedure?  To lower your risk of infection: ? Your health care team will wash or sanitize their hands. ? Your skin will be washed with soap.  An IV tube may be inserted into one of your veins.  You may be given one or more of the following: ? A medicine to help you relax (sedative). ? A medicine to numb the opening of the urethra (local anesthetic). ? A medicine to make you fall asleep (general anesthetic). ? A medicine that is injected into your spine to numb the area below and slightly above the injection site (spinal anesthetic). ? A medicine that is injected into an  area of your body to numb everything below the injection site (regional anesthetic).  You will lie on your back with your knees bent and spread apart.  The cystoscope or ureteroscope will be inserted into your urethra and guided into your bladder or ureters.  Your bladder may be slowly filled with germ-free (sterile) water. This will make it easier for your health care provider to view the wall or lining of your bladder.  Small instruments will be inserted through the scope to collect a small tissue sample that will be examined under a microscope. The procedure may vary among health care providers and hospitals. What happens after the procedure?  You may be asked to empty your bladder, or your bladder may be emptied for you.  Your blood pressure, heart rate, breathing rate, and blood oxygen level will be monitored until the medicines you were given wear off.  Do not drive for 24 hours if you received a sedative. This information is not intended to replace advice given to you by your health care provider. Make sure you discuss any questions you have with your health care provider. Document Released: 04/03/2015 Document Revised: 08/23/2015 Document  Reviewed: 04/03/2015 Elsevier Interactive Patient Education  2019 [employer]. Bladder Cancer  Bladder cancer is an abnormal growth of tissue in the bladder. The bladder is the balloon-like sac in the pelvis. It collects and stores urine that comes from the kidneys through the ureters. The bladder wall is made of layers. If cancer spreads into these layers and through the wall of the bladder, it becomes more difficult to treat. What are the causes? The cause of this condition is not known. What increases the risk? The following factors may make you more likely to develop this condition:  Smoking.  Workplace risks (occupational exposures), such as rubber, leather, textile, dyes, chemicals, and paint.  Being white.  Your age. Most people  with bladder cancer are over the age of 39.  Being female.  Having chronic bladder inflammation.  Having a personal history of bladder cancer.  Having a family history of bladder cancer (heredity).  Having had chemotherapy or radiation therapy to the pelvis.  Having been exposed to arsenic. What are the signs or symptoms? Initial symptoms of this condition include:  Blood in the urine.  Painful urination.  Frequent bladder or urine infections.  Increase in urgency and frequency of urination. Advanced symptoms of this condition include:  Not being able to urinate.  Low back pain on one side.  Loss of appetite.  Weight loss.  Fatigue.  Swelling in the feet.  Bone pain. How is this diagnosed? This condition is diagnosed based on your medical history, a physical exam, urine tests, lab tests, imaging tests, and your symptoms. You may also have other tests or procedures done, such as:  A narrow tube being inserted into your bladder through your urethra (cystoscopy) in order to view the lining of your bladder for tumors.  A biopsy to sample the tumor to see if cancer is present. If cancer is present, it will then be staged to determine its severity and extent. Staging is an assessment of:  The size of the tumor.  Whether the cancer has spread.  Where the cancer has spread. It is important to know how deeply into the bladder wall cancer has grown and whether cancer has spread to any other parts of your body. Staging may require blood tests or imaging tests, such as a CT scan, MRI, bone scan, or chest X-ray. How is this treated? Based on the stage of cancer, one treatment or a combination of treatments may be recommended. The most common forms of treatment are:  Surgery to remove the cancer. Procedures that may be done include transurethral resection and cystectomy.  Radiation therapy. This is high-energy X-rays or other particles. This is often used in combination with  chemotherapy.  Chemotherapy. During this treatment, medicines are used to kill cancer cells.  Immunotherapy. This uses medicines to help your own immune system destroy cancer cells. Follow these instructions at home:  Take over-the-counter and prescription medicines only as told by your health care provider.  Maintain a healthy diet. Some of your treatments might affect your appetite.  Consider joining a support group. This may help you learn to cope with the stress of having bladder cancer.  Tell your cancer care team if you develop side effects. They may be able to recommend ways to relieve them.  Keep all follow-up visits as told by your health care provider. This is important. Where to find more information  American Cancer Society: www.cancer.[place]): www.cancer.gov Contact a health care provider if:  You have symptoms of a urinary tract infection. These include: ? Fever. ? Chills. ? Weakness. ? Muscle aches. ? Abdominal pain. ? Frequent and intense urge to urinate. ? Burning feeling in the bladder or urethra during urination. Get help right away if:  There is blood in your urine.  You cannot urinate.  You have severe pain or other symptoms that do not go away. Summary  Bladder cancer is an abnormal growth of tissue in the bladder.  This condition is diagnosed based on your medical history, a physical exam, urine tests, lab tests, imaging tests, and your symptoms.  Based on the stage of cancer, surgery, chemotherapy, or a combination of treatments may be recommended.  Consider joining a support group. This may help you learn to cope with the stress of having bladder cancer. This information is not intended to replace advice given to you by your health care provider. Make sure you discuss any questions you have with your health care provider. Document Released: 03/20/2003 Document Revised: 02/19/2016 Document Reviewed:  02/19/2016 Elsevier Interactive Patient Education  2019 [place]   1) The drugs that you were given will stay in your system until tomorrow so for the next 24 hours you should not:  A) Drive an automobile B) Make any legal decisions C) Drink any alcoholic beverage  2) You may resume regular meals tomorrow.  Today it is better to start with liquids and gradually work up to solid foods.  You may eat anything you prefer, but it is better to start with liquids, then soup and crackers, and gradually work up to solid foods.  3) Please notify your doctor immediately if you have any unusual bleeding, trouble breathing, redness and pain at the surgery site, drainage, fever, or pain not relieved by medication.  4) Additional Instructions:  Please contact your physician with any problems or Same Day Surgery at [phone], Monday through Friday 6 am to 4 pm, or [place] at [facility] number at [phone].

## 2018-04-21 ENCOUNTER — Encounter: Payer: Self-pay | Admitting: Urology

## 2018-04-22 LAB — SURGICAL PATHOLOGY

## 2018-05-05 DIAGNOSIS — C674 Malignant neoplasm of posterior wall of bladder: Secondary | ICD-10-CM | POA: Diagnosis not present

## 2018-05-20 DIAGNOSIS — C679 Malignant neoplasm of bladder, unspecified: Secondary | ICD-10-CM | POA: Diagnosis not present

## 2018-05-20 DIAGNOSIS — C674 Malignant neoplasm of posterior wall of bladder: Secondary | ICD-10-CM | POA: Diagnosis not present

## 2018-06-02 DIAGNOSIS — C674 Malignant neoplasm of posterior wall of bladder: Secondary | ICD-10-CM | POA: Diagnosis not present

## 2018-06-09 DIAGNOSIS — C674 Malignant neoplasm of posterior wall of bladder: Secondary | ICD-10-CM | POA: Diagnosis not present

## 2018-06-09 DIAGNOSIS — Z79899 Other long term (current) drug therapy: Secondary | ICD-10-CM | POA: Diagnosis not present

## 2018-06-16 DIAGNOSIS — C674 Malignant neoplasm of posterior wall of bladder: Secondary | ICD-10-CM | POA: Diagnosis not present

## 2018-06-21 DIAGNOSIS — Z08 Encounter for follow-up examination after completed treatment for malignant neoplasm: Secondary | ICD-10-CM | POA: Diagnosis not present

## 2018-06-21 DIAGNOSIS — X32XXXA Exposure to sunlight, initial encounter: Secondary | ICD-10-CM | POA: Diagnosis not present

## 2018-06-21 DIAGNOSIS — L570 Actinic keratosis: Secondary | ICD-10-CM | POA: Diagnosis not present

## 2018-06-21 DIAGNOSIS — Z85820 Personal history of malignant melanoma of skin: Secondary | ICD-10-CM | POA: Diagnosis not present

## 2018-06-21 DIAGNOSIS — L820 Inflamed seborrheic keratosis: Secondary | ICD-10-CM | POA: Diagnosis not present

## 2018-06-22 DIAGNOSIS — C674 Malignant neoplasm of posterior wall of bladder: Secondary | ICD-10-CM | POA: Diagnosis not present

## 2018-06-22 DIAGNOSIS — Z79899 Other long term (current) drug therapy: Secondary | ICD-10-CM | POA: Diagnosis not present

## 2018-06-23 DIAGNOSIS — Z79899 Other long term (current) drug therapy: Secondary | ICD-10-CM | POA: Diagnosis not present

## 2018-06-26 DIAGNOSIS — C674 Malignant neoplasm of posterior wall of bladder: Secondary | ICD-10-CM | POA: Diagnosis not present

## 2018-06-26 DIAGNOSIS — C679 Malignant neoplasm of bladder, unspecified: Secondary | ICD-10-CM | POA: Diagnosis not present

## 2018-06-30 DIAGNOSIS — C674 Malignant neoplasm of posterior wall of bladder: Secondary | ICD-10-CM | POA: Diagnosis not present

## 2018-07-07 DIAGNOSIS — C674 Malignant neoplasm of posterior wall of bladder: Secondary | ICD-10-CM | POA: Diagnosis not present

## 2018-07-19 DIAGNOSIS — R319 Hematuria, unspecified: Secondary | ICD-10-CM | POA: Diagnosis not present

## 2018-07-19 DIAGNOSIS — Z0000 Encounter for general adult medical examination without abnormal findings: Secondary | ICD-10-CM | POA: Diagnosis not present

## 2018-07-26 DIAGNOSIS — E782 Mixed hyperlipidemia: Secondary | ICD-10-CM | POA: Diagnosis not present

## 2018-07-26 DIAGNOSIS — R7989 Other specified abnormal findings of blood chemistry: Secondary | ICD-10-CM | POA: Diagnosis not present

## 2018-07-26 DIAGNOSIS — Z0000 Encounter for general adult medical examination without abnormal findings: Secondary | ICD-10-CM | POA: Diagnosis not present

## 2018-07-26 DIAGNOSIS — C689 Malignant neoplasm of urinary organ, unspecified: Secondary | ICD-10-CM | POA: Diagnosis not present

## 2018-08-18 DIAGNOSIS — Z8551 Personal history of malignant neoplasm of bladder: Secondary | ICD-10-CM | POA: Diagnosis not present

## 2018-09-08 DIAGNOSIS — C679 Malignant neoplasm of bladder, unspecified: Secondary | ICD-10-CM | POA: Diagnosis not present

## 2018-09-08 DIAGNOSIS — Z8371 Family history of colonic polyps: Secondary | ICD-10-CM | POA: Diagnosis not present

## 2018-09-08 DIAGNOSIS — C674 Malignant neoplasm of posterior wall of bladder: Secondary | ICD-10-CM | POA: Diagnosis not present

## 2018-09-08 DIAGNOSIS — Z86010 Personal history of colonic polyps: Secondary | ICD-10-CM | POA: Diagnosis not present

## 2018-09-08 DIAGNOSIS — K219 Gastro-esophageal reflux disease without esophagitis: Secondary | ICD-10-CM | POA: Diagnosis not present

## 2018-09-15 DIAGNOSIS — C674 Malignant neoplasm of posterior wall of bladder: Secondary | ICD-10-CM | POA: Diagnosis not present

## 2018-09-15 DIAGNOSIS — K219 Gastro-esophageal reflux disease without esophagitis: Secondary | ICD-10-CM | POA: Diagnosis not present

## 2018-09-15 DIAGNOSIS — Z8371 Family history of colonic polyps: Secondary | ICD-10-CM | POA: Diagnosis not present

## 2018-09-15 DIAGNOSIS — Z86010 Personal history of colonic polyps: Secondary | ICD-10-CM | POA: Diagnosis not present

## 2018-09-21 DIAGNOSIS — D485 Neoplasm of uncertain behavior of skin: Secondary | ICD-10-CM | POA: Diagnosis not present

## 2018-09-21 DIAGNOSIS — L570 Actinic keratosis: Secondary | ICD-10-CM | POA: Diagnosis not present

## 2018-09-22 DIAGNOSIS — C674 Malignant neoplasm of posterior wall of bladder: Secondary | ICD-10-CM | POA: Diagnosis not present

## 2018-09-27 DIAGNOSIS — C674 Malignant neoplasm of posterior wall of bladder: Secondary | ICD-10-CM | POA: Diagnosis not present

## 2018-09-27 DIAGNOSIS — Z8551 Personal history of malignant neoplasm of bladder: Secondary | ICD-10-CM | POA: Diagnosis not present

## 2018-09-29 DIAGNOSIS — Z79899 Other long term (current) drug therapy: Secondary | ICD-10-CM | POA: Diagnosis not present

## 2018-10-04 DIAGNOSIS — H02834 Dermatochalasis of left upper eyelid: Secondary | ICD-10-CM | POA: Diagnosis not present

## 2018-10-04 DIAGNOSIS — Z8551 Personal history of malignant neoplasm of bladder: Secondary | ICD-10-CM | POA: Diagnosis not present

## 2018-10-04 DIAGNOSIS — Z1159 Encounter for screening for other viral diseases: Secondary | ICD-10-CM | POA: Diagnosis not present

## 2018-10-04 DIAGNOSIS — Z01818 Encounter for other preprocedural examination: Secondary | ICD-10-CM | POA: Diagnosis not present

## 2018-10-04 DIAGNOSIS — H02831 Dermatochalasis of right upper eyelid: Secondary | ICD-10-CM | POA: Diagnosis not present

## 2018-10-06 DIAGNOSIS — H02834 Dermatochalasis of left upper eyelid: Secondary | ICD-10-CM | POA: Diagnosis not present

## 2018-10-06 DIAGNOSIS — H02831 Dermatochalasis of right upper eyelid: Secondary | ICD-10-CM | POA: Diagnosis not present

## 2018-10-06 DIAGNOSIS — D485 Neoplasm of uncertain behavior of skin: Secondary | ICD-10-CM | POA: Diagnosis not present

## 2018-10-14 DIAGNOSIS — Z8551 Personal history of malignant neoplasm of bladder: Secondary | ICD-10-CM | POA: Diagnosis not present

## 2018-10-18 ENCOUNTER — Encounter: Payer: Self-pay | Admitting: Women's Health

## 2018-10-18 DIAGNOSIS — Z1231 Encounter for screening mammogram for malignant neoplasm of breast: Secondary | ICD-10-CM | POA: Diagnosis not present

## 2018-10-21 DIAGNOSIS — C674 Malignant neoplasm of posterior wall of bladder: Secondary | ICD-10-CM | POA: Diagnosis not present

## 2018-10-22 DIAGNOSIS — Z79899 Other long term (current) drug therapy: Secondary | ICD-10-CM | POA: Diagnosis not present

## 2018-11-03 DIAGNOSIS — Z01818 Encounter for other preprocedural examination: Secondary | ICD-10-CM | POA: Diagnosis not present

## 2018-11-03 DIAGNOSIS — Z8551 Personal history of malignant neoplasm of bladder: Secondary | ICD-10-CM | POA: Diagnosis not present

## 2018-11-03 DIAGNOSIS — C674 Malignant neoplasm of posterior wall of bladder: Secondary | ICD-10-CM | POA: Diagnosis not present

## 2018-11-03 DIAGNOSIS — Z79899 Other long term (current) drug therapy: Secondary | ICD-10-CM | POA: Diagnosis not present

## 2018-11-04 ENCOUNTER — Other Ambulatory Visit (HOSPITAL_COMMUNITY): Payer: Self-pay | Admitting: Urology

## 2018-11-04 ENCOUNTER — Other Ambulatory Visit: Payer: Self-pay | Admitting: Urology

## 2018-11-04 DIAGNOSIS — C679 Malignant neoplasm of bladder, unspecified: Secondary | ICD-10-CM

## 2018-11-09 DIAGNOSIS — Z1211 Encounter for screening for malignant neoplasm of colon: Secondary | ICD-10-CM | POA: Diagnosis not present

## 2018-11-09 DIAGNOSIS — Z8371 Family history of colonic polyps: Secondary | ICD-10-CM | POA: Diagnosis not present

## 2018-11-09 DIAGNOSIS — K640 First degree hemorrhoids: Secondary | ICD-10-CM | POA: Diagnosis not present

## 2018-11-09 DIAGNOSIS — Z86010 Personal history of colonic polyps: Secondary | ICD-10-CM | POA: Diagnosis not present

## 2018-11-09 DIAGNOSIS — K5730 Diverticulosis of large intestine without perforation or abscess without bleeding: Secondary | ICD-10-CM | POA: Diagnosis not present

## 2018-11-17 ENCOUNTER — Other Ambulatory Visit: Payer: Self-pay

## 2018-11-17 ENCOUNTER — Ambulatory Visit
Admission: RE | Admit: 2018-11-17 | Discharge: 2018-11-17 | Disposition: A | Discharge status: Home / Self Care | Payer: BC Managed Care – PPO | Source: Ambulatory Visit | Attending: Urology | Admitting: Urology

## 2018-11-17 DIAGNOSIS — C679 Malignant neoplasm of bladder, unspecified: Secondary | ICD-10-CM

## 2018-11-17 DIAGNOSIS — N1330 Unspecified hydronephrosis: Secondary | ICD-10-CM | POA: Diagnosis not present

## 2018-11-17 LAB — POCT I-STAT CREATININE: Creatinine, Ser: 0.7 mg/dL (ref 0.44–1.00)

## 2018-11-17 MED ORDER — IOHEXOL 300 MG/ML  SOLN
80.0000 mL | Freq: Once | INTRAMUSCULAR | Status: AC | PRN
  Administered 2018-11-17: 80 mL via INTRAVENOUS

## 2018-11-19 DIAGNOSIS — Z8551 Personal history of malignant neoplasm of bladder: Secondary | ICD-10-CM | POA: Diagnosis not present

## 2018-12-07 ENCOUNTER — Encounter: Payer: Self-pay | Admitting: Women's Health

## 2018-12-07 ENCOUNTER — Other Ambulatory Visit: Payer: Self-pay

## 2018-12-07 ENCOUNTER — Ambulatory Visit (INDEPENDENT_AMBULATORY_CARE_PROVIDER_SITE_OTHER): Payer: BC Managed Care – PPO | Admitting: Women's Health

## 2018-12-07 VITALS — BP 122/80 | Ht 64.0 in | Wt 121.0 lb

## 2018-12-07 DIAGNOSIS — Z01419 Encounter for gynecological examination (general) (routine) without abnormal findings: Secondary | ICD-10-CM

## 2018-12-07 DIAGNOSIS — C679 Malignant neoplasm of bladder, unspecified: Secondary | ICD-10-CM | POA: Insufficient documentation

## 2018-12-07 NOTE — Patient Instructions (Signed)
Vit D3 2000 iu daily  Health Maintenance for Postmenopausal Women Menopause is a normal process in which your ability to get pregnant comes to an end. This process happens slowly over many months or years, usually between the ages of 48 and 55. Menopause is complete when you have missed your menstrual periods for 12 months. It is important to talk with your health care provider about some of the most common conditions that affect women after menopause (postmenopausal women). These include heart disease, cancer, and bone loss (osteoporosis). Adopting a healthy lifestyle and getting preventive care can help to promote your health and wellness. The actions you take can also lower your chances of developing some of these common conditions. What should I know about menopause? During menopause, you may get a number of symptoms, such as:  Hot flashes. These can be moderate or severe.  Night sweats.  Decrease in sex drive.  Mood swings.  Headaches.  Tiredness.  Irritability.  Memory problems.  Insomnia. Choosing to treat or not to treat these symptoms is a decision that you make with your health care provider. Do I need hormone replacement therapy?  Hormone replacement therapy is effective in treating symptoms that are caused by menopause, such as hot flashes and night sweats.  Hormone replacement carries certain risks, especially as you become older. If you are thinking about using estrogen or estrogen with progestin, discuss the benefits and risks with your health care provider. What is my risk for heart disease and stroke? The risk of heart disease, heart attack, and stroke increases as you age. One of the causes may be a change in the body's hormones during menopause. This can affect how your body uses dietary fats, triglycerides, and cholesterol. Heart attack and stroke are medical emergencies. There are many things that you can do to help prevent heart disease and stroke. Watch your  blood pressure  High blood pressure causes heart disease and increases the risk of stroke. This is more likely to develop in people who have high blood pressure readings, are of African descent, or are overweight.  Have your blood pressure checked: ? Every 3-5 years if you are 18-39 years of age. ? Every year if you are 40 years old or older. Eat a healthy diet   Eat a diet that includes plenty of vegetables, fruits, low-fat dairy products, and lean protein.  Do not eat a lot of foods that are high in solid fats, added sugars, or sodium. Get regular exercise Get regular exercise. This is one of the most important things you can do for your health. Most adults should:  Try to exercise for at least 150 minutes each week. The exercise should increase your heart rate and make you sweat (moderate-intensity exercise).  Try to do strengthening exercises at least twice each week. Do these in addition to the moderate-intensity exercise.  Spend less time sitting. Even light physical activity can be beneficial. Other tips  Work with your health care provider to achieve or maintain a healthy weight.  Do not use any products that contain nicotine or tobacco, such as cigarettes, e-cigarettes, and chewing tobacco. If you need help quitting, ask your health care provider.  Know your numbers. Ask your health care provider to check your cholesterol and your blood sugar (glucose). Continue to have your blood tested as directed by your health care provider. Do I need screening for cancer? Depending on your health history and family history, you may need to have cancer screening at   different stages of your life. This may include screening for:  Breast cancer.  Cervical cancer.  Lung cancer.  Colorectal cancer. What is my risk for osteoporosis? After menopause, you may be at increased risk for osteoporosis. Osteoporosis is a condition in which bone destruction happens more quickly than new bone  creation. To help prevent osteoporosis or the bone fractures that can happen because of osteoporosis, you may take the following actions:  If you are 19-50 years old, get at least 1,000 mg of calcium and at least 600 mg of vitamin D per day.  If you are older than age 50 but younger than age 70, get at least 1,200 mg of calcium and at least 600 mg of vitamin D per day.  If you are older than age 70, get at least 1,200 mg of calcium and at least 800 mg of vitamin D per day. Smoking and drinking excessive alcohol increase the risk of osteoporosis. Eat foods that are rich in calcium and vitamin D, and do weight-bearing exercises several times each week as directed by your health care provider. How does menopause affect my mental health? Depression may occur at any age, but it is more common as you become older. Common symptoms of depression include:  Low or sad mood.  Changes in sleep patterns.  Changes in appetite or eating patterns.  Feeling an overall lack of motivation or enjoyment of activities that you previously enjoyed.  Frequent crying spells. Talk with your health care provider if you think that you are experiencing depression. General instructions See your health care provider for regular wellness exams and vaccines. This may include:  Scheduling regular health, dental, and eye exams.  Getting and maintaining your vaccines. These include: ? Influenza vaccine. Get this vaccine each year before the flu season begins. ? Pneumonia vaccine. ? Shingles vaccine. ? Tetanus, diphtheria, and pertussis (Tdap) booster vaccine. Your health care provider may also recommend other immunizations. Tell your health care provider if you have ever been abused or do not feel safe at home. Summary  Menopause is a normal process in which your ability to get pregnant comes to an end.  This condition causes hot flashes, night sweats, decreased interest in sex, mood swings, headaches, or lack of  sleep.  Treatment for this condition may include hormone replacement therapy.  Take actions to keep yourself healthy, including exercising regularly, eating a healthy diet, watching your weight, and checking your blood pressure and blood sugar levels.  Get screened for cancer and depression. Make sure that you are up to date with all your vaccines. This information is not intended to replace advice given to you by your health care provider. Make sure you discuss any questions you have with your health care provider. Document Released: 05/09/2005 Document Revised: 03/10/2018 Document Reviewed: 03/10/2018 Elsevier Patient Education  2020 Elsevier Inc.  

## 2018-12-07 NOTE — Progress Notes (Signed)
Jennifer Hammond [birth year] [id number]    History:    Presents for annual exam.  1991 TAH with BSO for endometriosis.  Normal Pap and mammogram history.  09/2017 diagnosed with bladder cancer had a TUR, 03/2018- bladder biopsy.  Osteoporosis completed 5 years of Fosamax primary care manages.  Last DEXA showed improvement T score -1.4.  Has had elevated cholesterol has follow-up scheduled next month.  Primary care manages  GERD, MVP and migraines which are rare.  2012 melanoma has annual skin checks, 2019 "pink" melanoma, also had squamous.  10/2018- colonoscopy.  Right hip osteoarthritis orthopedist managing   Past medical history, past surgical history, family history and social history were all reviewed and documented in the EPIC chart.  Works at [employer] in [employer].  Has 2 adopted children ages 80 and 83, daughter is getting married this December and has had to make many changes in the plans due to [place].  Father history of bladder cancer, mother died this year at age 71 in assisted living.  ROS:  A ROS was performed and pertinent positives and negatives are included.  Exam:  Vitals:   12/07/18 1605  BP: 122/80  Weight: 121 lb (54.9 kg)  Height: 5\' 4"  (1.626 m)   Body mass index is 20.77 kg/m.   General appearance:  Normal Thyroid:  Symmetrical, normal in size, without palpable masses or nodularity. Respiratory  Auscultation:  Clear without wheezing or rhonchi Cardiovascular  Auscultation:  Regular rate, without rubs, murmurs or gallops  Edema/varicosities:  Not grossly evident Abdominal  Soft,nontender, without masses, guarding or rebound.  Liver/spleen:  No organomegaly noted  Hernia:  None appreciated  Skin  Inspection:  Grossly normal   Breasts: Examined lying and sitting.     Right: Without masses, retractions, discharge or axillary adenopathy.     Left: Without masses, retractions, discharge or axillary adenopathy. Gentitourinary   Inguinal/mons:  Normal  without inguinal adenopathy  External genitalia:  Normal  BUS/Urethra/Skene's glands:  Normal  Vagina:  Normal  Cervix: And uterus absent   Adnexa/parametria:     Rt: Without masses or tenderness.   Lt: Without masses or tenderness.  Anus and perineum: Normal  Digital rectal exam: Normal sphincter tone without palpated masses or tenderness  Assessment/Plan:  63 y.o. MWF G0 +2 adopted for annual exam with no complaints.  1991 TAH with BSO for endometriosis on no HRT 09/2017 bladder cancer, 03/2018- bladder biopsy Osteoporosis completed 5 years of Fosamax primary care manages 2012 melanoma, 2019 pink melanoma- annual check with dermatology Osteoarthritis right hip- orthopedist manages  Plan: SBEs, continue annual screening mammogram, calcium rich foods, vitamin D 2000 daily encouraged.  Reviewed importance of weightbearing and balance type exercise, home safety and fall prevention discussed.  Continue annual skin checks and sunscreens.  Condolences given on the death of her mother, self-care and leisure activities encouraged.      [place], 4:53 PM 12/07/2018

## 2018-12-16 DIAGNOSIS — M5030 Other cervical disc degeneration, unspecified cervical region: Secondary | ICD-10-CM | POA: Diagnosis not present

## 2018-12-23 DIAGNOSIS — Z09 Encounter for follow-up examination after completed treatment for conditions other than malignant neoplasm: Secondary | ICD-10-CM | POA: Diagnosis not present

## 2018-12-23 DIAGNOSIS — Z96642 Presence of left artificial hip joint: Secondary | ICD-10-CM | POA: Diagnosis not present

## 2018-12-27 DIAGNOSIS — Z85820 Personal history of malignant melanoma of skin: Secondary | ICD-10-CM | POA: Diagnosis not present

## 2018-12-27 DIAGNOSIS — D2271 Melanocytic nevi of right lower limb, including hip: Secondary | ICD-10-CM | POA: Diagnosis not present

## 2018-12-27 DIAGNOSIS — C44712 Basal cell carcinoma of skin of right lower limb, including hip: Secondary | ICD-10-CM | POA: Diagnosis not present

## 2018-12-27 DIAGNOSIS — D0472 Carcinoma in situ of skin of left lower limb, including hip: Secondary | ICD-10-CM | POA: Diagnosis not present

## 2018-12-27 DIAGNOSIS — X32XXXA Exposure to sunlight, initial encounter: Secondary | ICD-10-CM | POA: Diagnosis not present

## 2018-12-27 DIAGNOSIS — L570 Actinic keratosis: Secondary | ICD-10-CM | POA: Diagnosis not present

## 2018-12-27 DIAGNOSIS — C44519 Basal cell carcinoma of skin of other part of trunk: Secondary | ICD-10-CM | POA: Diagnosis not present

## 2018-12-27 DIAGNOSIS — D485 Neoplasm of uncertain behavior of skin: Secondary | ICD-10-CM | POA: Diagnosis not present

## 2018-12-27 DIAGNOSIS — D2261 Melanocytic nevi of right upper limb, including shoulder: Secondary | ICD-10-CM | POA: Diagnosis not present

## 2018-12-27 DIAGNOSIS — Z85828 Personal history of other malignant neoplasm of skin: Secondary | ICD-10-CM | POA: Diagnosis not present

## 2019-01-11 ENCOUNTER — Other Ambulatory Visit: Payer: Self-pay

## 2019-01-11 ENCOUNTER — Ambulatory Visit: Payer: BC Managed Care – PPO

## 2019-01-11 DIAGNOSIS — Z23 Encounter for immunization: Secondary | ICD-10-CM

## 2019-01-19 ENCOUNTER — Ambulatory Visit: Payer: BC Managed Care – PPO

## 2019-01-24 DIAGNOSIS — E782 Mixed hyperlipidemia: Secondary | ICD-10-CM | POA: Diagnosis not present

## 2019-02-07 DIAGNOSIS — D485 Neoplasm of uncertain behavior of skin: Secondary | ICD-10-CM | POA: Diagnosis not present

## 2019-02-07 DIAGNOSIS — D0472 Carcinoma in situ of skin of left lower limb, including hip: Secondary | ICD-10-CM | POA: Diagnosis not present

## 2019-02-07 DIAGNOSIS — L570 Actinic keratosis: Secondary | ICD-10-CM | POA: Diagnosis not present

## 2019-02-21 DIAGNOSIS — L570 Actinic keratosis: Secondary | ICD-10-CM | POA: Diagnosis not present

## 2019-02-21 DIAGNOSIS — C44712 Basal cell carcinoma of skin of right lower limb, including hip: Secondary | ICD-10-CM | POA: Diagnosis not present

## 2019-02-21 DIAGNOSIS — C44519 Basal cell carcinoma of skin of other part of trunk: Secondary | ICD-10-CM | POA: Diagnosis not present

## 2019-03-15 DIAGNOSIS — R300 Dysuria: Secondary | ICD-10-CM | POA: Diagnosis not present

## 2019-03-15 DIAGNOSIS — R310 Gross hematuria: Secondary | ICD-10-CM | POA: Diagnosis not present

## 2019-03-15 DIAGNOSIS — Z8551 Personal history of malignant neoplasm of bladder: Secondary | ICD-10-CM | POA: Diagnosis not present

## 2019-03-20 IMAGING — CT CT ABD-PEL WO/W CM
3 of 12 series · 12 of 46 positions shown, 18 images · IV contrast (iopamidol)
Comparison: None.

CLINICAL DATA: Intermittent gross hematuria for 3 months. Personal
history of melanoma.

EXAM:
CT ABDOMEN AND PELVIS WITHOUT AND WITH CONTRAST
TECHNIQUE: Multidetector CT imaging of the abdomen and pelvis was performed
following the standard protocol before and following the bolus
administration of intravenous contrast.
CONTRAST:  125mL GDH7PE-J22 IOPAMIDOL (GDH7PE-J22) INJECTION 61%

[Series 2: without pre (person_name) · axial · non-contrast · 0.68mm/px · z∈[-1707,-1602]mm · 3 of 84 slices shown]
[im 11/84  soft-tissue]
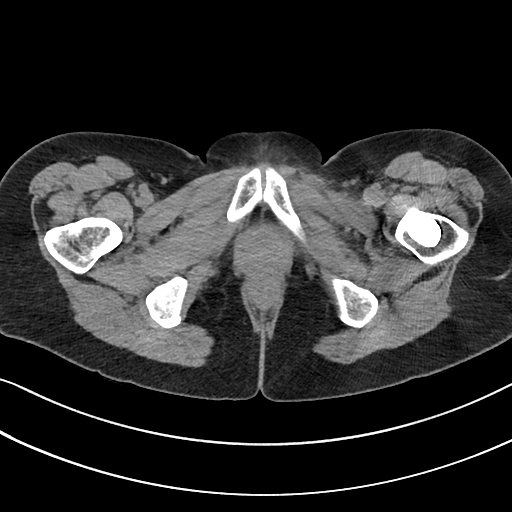
[im 21/84  soft-tissue]
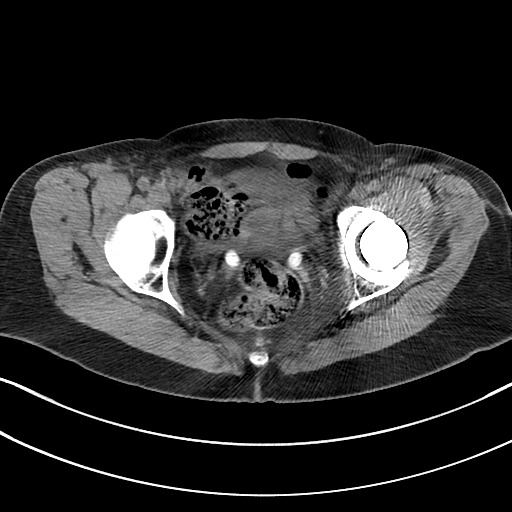
[im 32/84  soft-tissue]
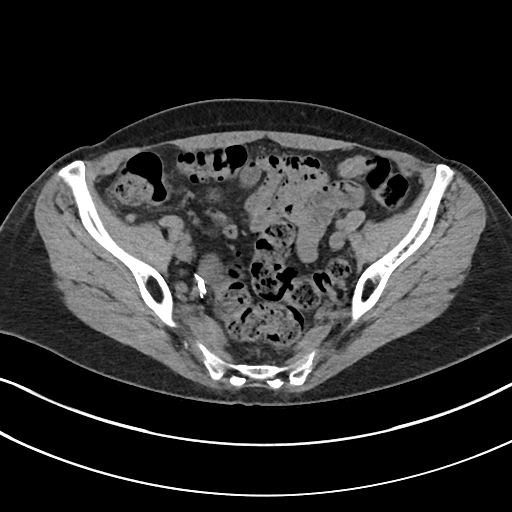

[Series 5: cor without without pre (person_name) · coronal · non-contrast · 0.68mm/px · 2 of 128 slices shown, 3 images]
[im 43/128  soft-tissue]
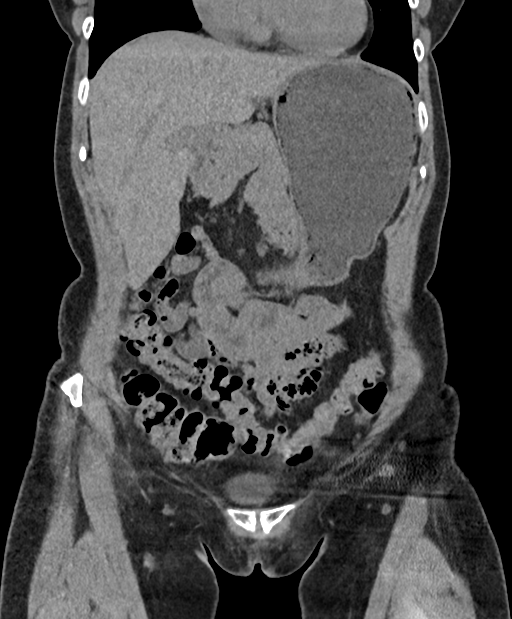
[im 43/128  bone]
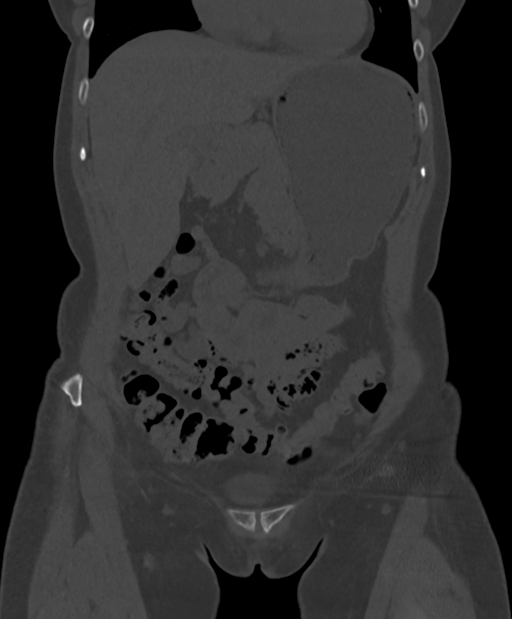
[im 85/128  soft-tissue]
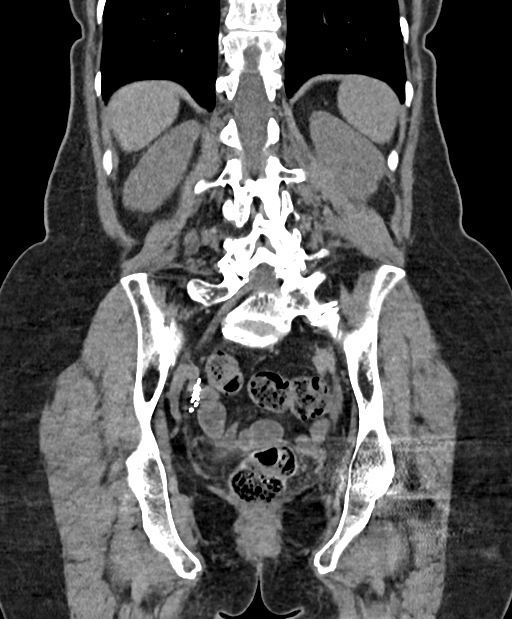

[Series 17: axial delay delay prone (person_name) · axial · delayed · 0.67mm/px · z∈[-1716,-1386]mm · 7 of 88 slices shown, 12 images]
[im 11/88  soft-tissue]
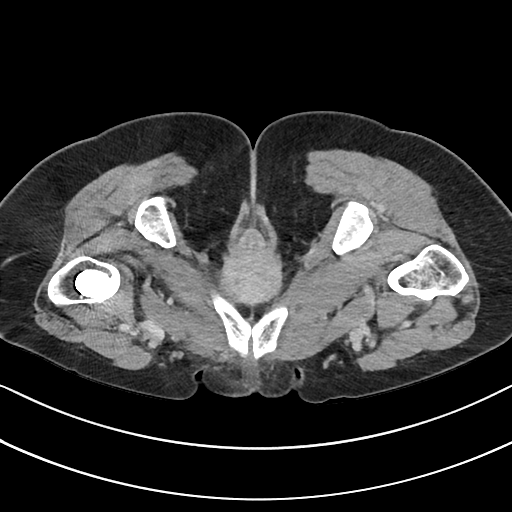
[im 11/88  bone]
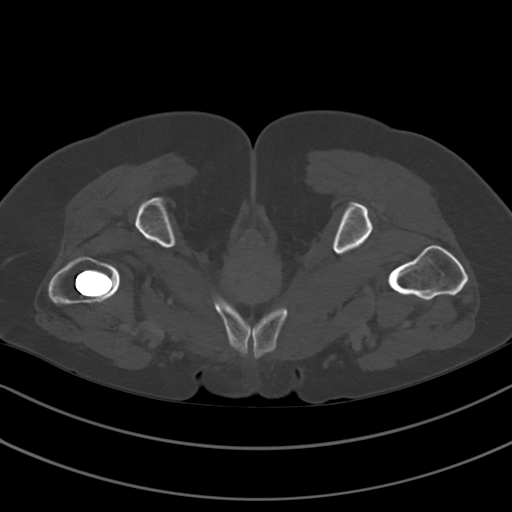
[im 22/88  soft-tissue]
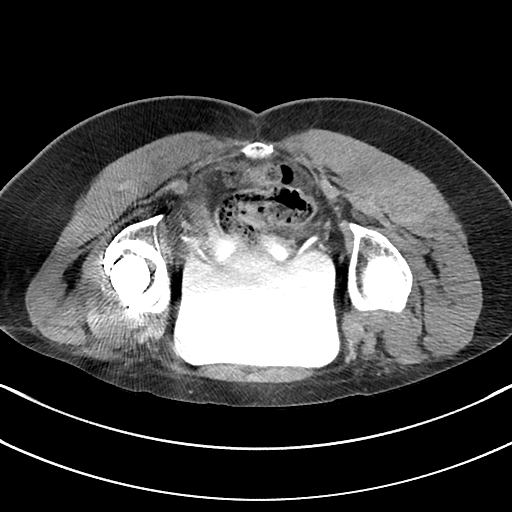
[im 33/88  soft-tissue]
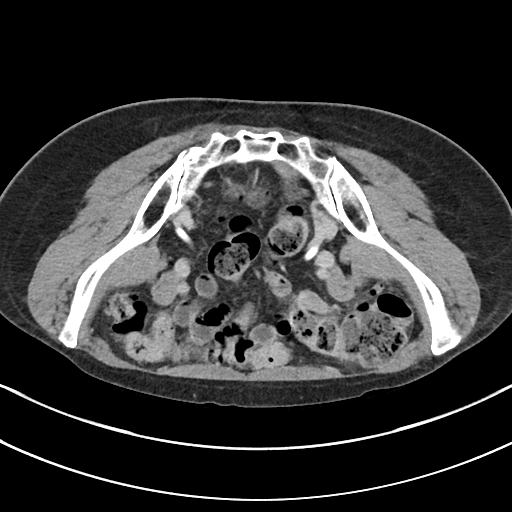
[im 44/88  soft-tissue]
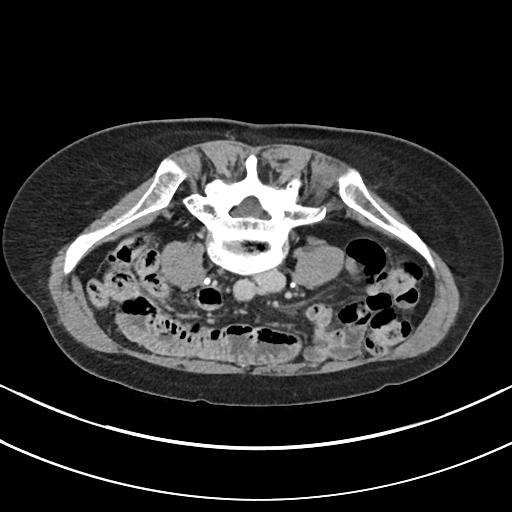
[im 44/88  lung]
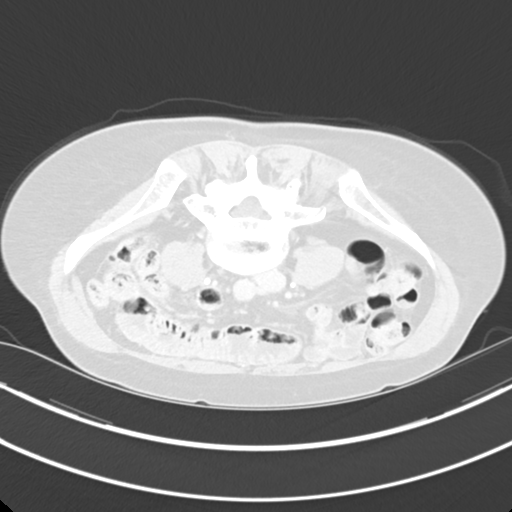
[im 55/88  soft-tissue]
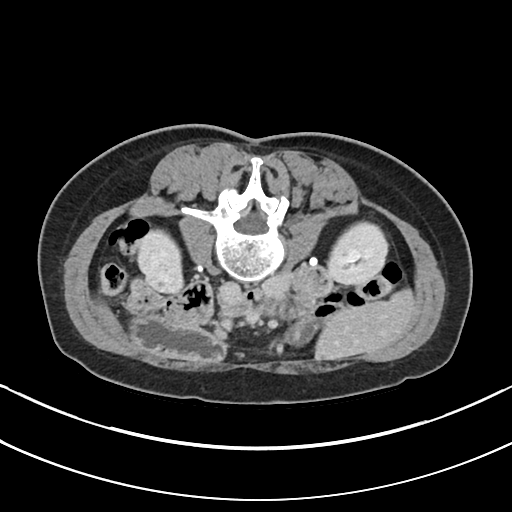
[im 55/88  lung]
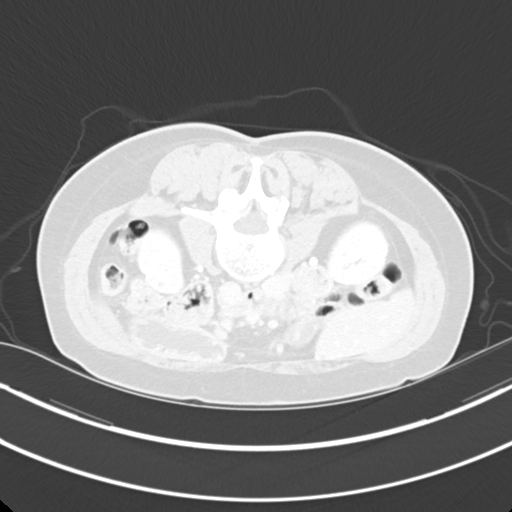
[im 66/88  soft-tissue]
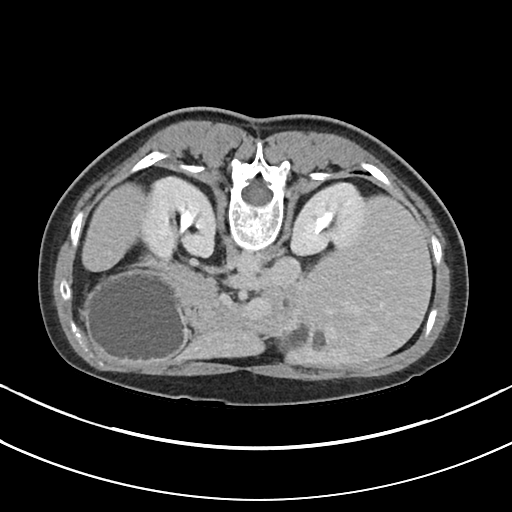
[im 66/88  lung]
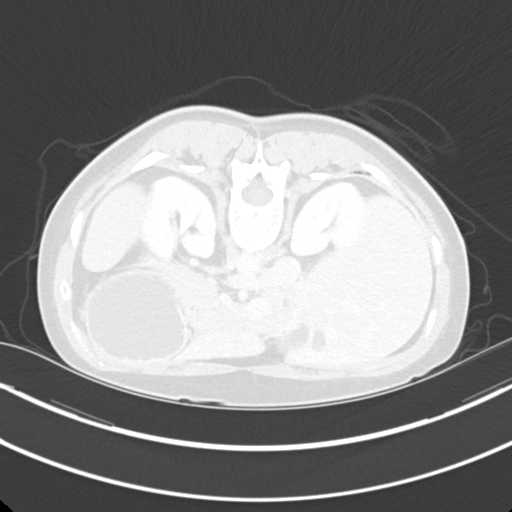
[im 77/88  soft-tissue]
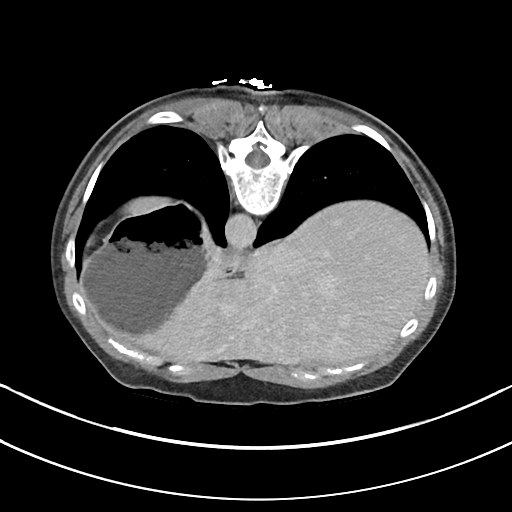
[im 77/88  lung]
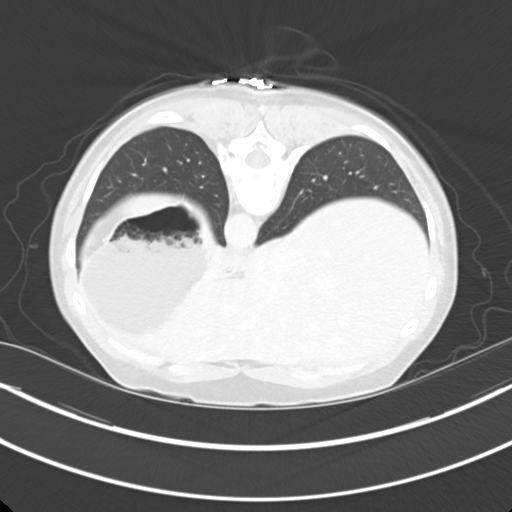

[12 of 46 positions shown; findings below may reference images not displayed]

FINDINGS: Lower Chest: No acute findings.

Hepatobiliary: No hepatic masses identified. A few hepatic cysts are
noted, largest in the the lateral segment of the left lobe measuring
2 cm. Gallbladder is unremarkable.

Pancreas:  No mass or inflammatory changes.

Spleen: Within normal limits in size and appearance.

Adrenals/Urinary Tract: No adrenal masses identified. No evidence of
renal or ureteral calculi, or hydronephrosis. A few tiny sub-cm left
renal cysts are noted. No complex cystic or solid renal masses
identified. No masses seen involving the ureters or bladder. A 3 mm
calculus is noted in the urinary bladder.

Stomach/Bowel: No evidence of obstruction, inflammatory process or
abnormal fluid collections. Normal appendix visualized.

Vascular/Lymphatic: No pathologically enlarged lymph nodes. No
abdominal aortic aneurysm.

Reproductive: Prior hysterectomy noted. Adnexal regions are
unremarkable in appearance. Pessary noted within the vagina.

Other:  None.

Musculoskeletal: No suspicious bone lesions identified. Left hip
prosthesis noted.
IMPRESSION: 3 mm calculus within urinary bladder. No evidence of ureteral
calculi, hydronephrosis, or other acute findings.

## 2019-04-11 DIAGNOSIS — Z8551 Personal history of malignant neoplasm of bladder: Secondary | ICD-10-CM | POA: Diagnosis not present

## 2019-04-11 DIAGNOSIS — R310 Gross hematuria: Secondary | ICD-10-CM | POA: Diagnosis not present

## 2019-07-12 DIAGNOSIS — D225 Melanocytic nevi of trunk: Secondary | ICD-10-CM | POA: Diagnosis not present

## 2019-07-12 DIAGNOSIS — L814 Other melanin hyperpigmentation: Secondary | ICD-10-CM | POA: Diagnosis not present

## 2019-07-12 DIAGNOSIS — Z85820 Personal history of malignant melanoma of skin: Secondary | ICD-10-CM | POA: Diagnosis not present

## 2019-07-12 DIAGNOSIS — Z85828 Personal history of other malignant neoplasm of skin: Secondary | ICD-10-CM | POA: Diagnosis not present

## 2019-07-18 DIAGNOSIS — Z8551 Personal history of malignant neoplasm of bladder: Secondary | ICD-10-CM | POA: Diagnosis not present

## 2019-07-20 DIAGNOSIS — Z131 Encounter for screening for diabetes mellitus: Secondary | ICD-10-CM | POA: Diagnosis not present

## 2019-07-20 DIAGNOSIS — Z0000 Encounter for general adult medical examination without abnormal findings: Secondary | ICD-10-CM | POA: Diagnosis not present

## 2019-07-20 DIAGNOSIS — R7989 Other specified abnormal findings of blood chemistry: Secondary | ICD-10-CM | POA: Diagnosis not present

## 2019-07-20 DIAGNOSIS — M818 Other osteoporosis without current pathological fracture: Secondary | ICD-10-CM | POA: Diagnosis not present

## 2019-07-20 DIAGNOSIS — E782 Mixed hyperlipidemia: Secondary | ICD-10-CM | POA: Diagnosis not present

## 2019-07-20 DIAGNOSIS — Z1389 Encounter for screening for other disorder: Secondary | ICD-10-CM | POA: Diagnosis not present

## 2019-07-27 DIAGNOSIS — C689 Malignant neoplasm of urinary organ, unspecified: Secondary | ICD-10-CM | POA: Diagnosis not present

## 2019-07-27 DIAGNOSIS — R002 Palpitations: Secondary | ICD-10-CM | POA: Diagnosis not present

## 2019-07-27 DIAGNOSIS — M818 Other osteoporosis without current pathological fracture: Secondary | ICD-10-CM | POA: Diagnosis not present

## 2019-07-27 DIAGNOSIS — Z0000 Encounter for general adult medical examination without abnormal findings: Secondary | ICD-10-CM | POA: Diagnosis not present

## 2019-08-03 DIAGNOSIS — M810 Age-related osteoporosis without current pathological fracture: Secondary | ICD-10-CM | POA: Diagnosis not present

## 2019-08-30 ENCOUNTER — Other Ambulatory Visit: Payer: Self-pay

## 2019-08-31 ENCOUNTER — Encounter: Payer: Self-pay | Admitting: Nurse Practitioner

## 2019-08-31 ENCOUNTER — Ambulatory Visit: Payer: BC Managed Care – PPO | Admitting: Nurse Practitioner

## 2019-08-31 VITALS — BP 118/76

## 2019-08-31 DIAGNOSIS — N951 Menopausal and female climacteric states: Secondary | ICD-10-CM | POA: Diagnosis not present

## 2019-08-31 DIAGNOSIS — N898 Other specified noninflammatory disorders of vagina: Secondary | ICD-10-CM

## 2019-08-31 LAB — WET PREP FOR TRICH, YEAST, CLUE

## 2019-08-31 MED ORDER — ESTRING 2 MG VA RING: 2.0000 mg | VAGINAL_RING | VAGINAL | 4 refills | Status: DC

## 2019-08-31 NOTE — Progress Notes (Signed)
   Acute Office Visit  Subjective:    Patient ID: Jennifer Hammond, female    DOB: 07/07/1955, 64 y.o.   MRN: [id number]  Chief Complaint  Patient presents with  . Vaginal Dryness    JK backup MD    HPI Presents today for vaginal dryness that is not new for her.  Was on Estring in the past but decided to stop using when she was diagnosed with bladder cancer last year.  Dryness causes irritation and friction.  Would like to restart Estring and spoke with urology about this at her last visit and they agreed that it is a good idea.  Denies urinary symptoms, denies vaginal itching, discharge, or odor.  In the past she has had yeast infections with no symptoms so she would like this checked today.   Review of Systems  Constitutional: Negative.   Gastrointestinal: Negative.   Genitourinary: Positive for vaginal pain (due to dryness/friction). Negative for dysuria, frequency, urgency, vaginal bleeding and vaginal discharge.       Objective:    Physical Exam Constitutional:      Appearance: Normal appearance.  Genitourinary:    General: Normal vulva.     Vagina: Normal. No vaginal discharge, erythema or bleeding.     Cervix: Normal.     Uterus: Normal.      BP 118/76  Wt Readings from Last 3 Encounters:  12/07/18 121 lb (54.9 kg)  04/20/18 120 lb (54.4 kg)  12/01/17 122 lb (55.3 kg)   Wet prep negative     Assessment & Plan:   Problem List Items Addressed This Visit    None    Visit Diagnoses    Menopausal vaginal dryness    -  Primary   Relevant Medications   estradiol (ESTRING) 2 MG vaginal ring   Vaginal irritation       Relevant Orders   WET PREP FOR [place], YEAST, CLUE      Plan: Will restart 90 day Estring 2 mg vaginal ring.  Education provided on proper use, patient aware of low risk for blood clots and cancer. Follow up if symptoms worsen or do not improve.     Tamela Gammon [facility], 10:27 AM 08/31/2019

## 2019-10-07 DIAGNOSIS — Z1231 Encounter for screening mammogram for malignant neoplasm of breast: Secondary | ICD-10-CM | POA: Diagnosis not present

## 2019-10-17 DIAGNOSIS — Z8551 Personal history of malignant neoplasm of bladder: Secondary | ICD-10-CM | POA: Diagnosis not present

## 2019-10-17 DIAGNOSIS — Z03818 Encounter for observation for suspected exposure to other biological agents ruled out: Secondary | ICD-10-CM | POA: Diagnosis not present

## 2019-10-17 DIAGNOSIS — Z20822 Contact with and (suspected) exposure to covid-19: Secondary | ICD-10-CM | POA: Diagnosis not present

## 2019-10-19 ENCOUNTER — Other Ambulatory Visit: Payer: Self-pay | Admitting: Urology

## 2019-10-19 ENCOUNTER — Other Ambulatory Visit: Payer: Self-pay | Admitting: Orthopedic Surgery

## 2019-10-19 DIAGNOSIS — Z8551 Personal history of malignant neoplasm of bladder: Secondary | ICD-10-CM

## 2019-10-31 DIAGNOSIS — H524 Presbyopia: Secondary | ICD-10-CM | POA: Diagnosis not present

## 2019-10-31 DIAGNOSIS — H5213 Myopia, bilateral: Secondary | ICD-10-CM | POA: Diagnosis not present

## 2019-10-31 DIAGNOSIS — H2513 Age-related nuclear cataract, bilateral: Secondary | ICD-10-CM | POA: Diagnosis not present

## 2019-10-31 DIAGNOSIS — H52203 Unspecified astigmatism, bilateral: Secondary | ICD-10-CM | POA: Diagnosis not present

## 2019-11-04 ENCOUNTER — Ambulatory Visit: Payer: BC Managed Care – PPO

## 2019-11-09 ENCOUNTER — Ambulatory Visit: Payer: BC Managed Care – PPO

## 2019-11-09 ENCOUNTER — Other Ambulatory Visit
Admission: RE | Admit: 2019-11-09 | Discharge: 2019-11-09 | Disposition: A | Discharge status: Home / Self Care | Payer: BC Managed Care – PPO | Source: Home / Self Care | Attending: Urology | Admitting: Urology

## 2019-11-09 ENCOUNTER — Other Ambulatory Visit: Payer: Self-pay

## 2019-11-09 ENCOUNTER — Ambulatory Visit
Admission: RE | Admit: 2019-11-09 | Discharge: 2019-11-09 | Disposition: A | Discharge status: Home / Self Care | Payer: BC Managed Care – PPO | Source: Ambulatory Visit | Attending: Urology | Admitting: Urology

## 2019-11-09 DIAGNOSIS — N898 Other specified noninflammatory disorders of vagina: Secondary | ICD-10-CM | POA: Diagnosis not present

## 2019-11-09 DIAGNOSIS — Z8551 Personal history of malignant neoplasm of bladder: Secondary | ICD-10-CM | POA: Insufficient documentation

## 2019-11-09 DIAGNOSIS — X32XXXA Exposure to sunlight, initial encounter: Secondary | ICD-10-CM | POA: Diagnosis not present

## 2019-11-09 DIAGNOSIS — L821 Other seborrheic keratosis: Secondary | ICD-10-CM | POA: Diagnosis not present

## 2019-11-09 DIAGNOSIS — M87851 Other osteonecrosis, right femur: Secondary | ICD-10-CM | POA: Diagnosis not present

## 2019-11-09 DIAGNOSIS — L814 Other melanin hyperpigmentation: Secondary | ICD-10-CM | POA: Diagnosis not present

## 2019-11-09 DIAGNOSIS — L570 Actinic keratosis: Secondary | ICD-10-CM | POA: Diagnosis not present

## 2019-11-09 LAB — CREATININE, SERUM
Creatinine, Ser: 0.66 mg/dL (ref 0.44–1.00)
GFR calc Af Amer: >60 mL/min (ref >60)
GFR calc non Af Amer: >60 mL/min (ref >60)

## 2019-11-09 LAB — BUN: BUN: 15 mg/dL (ref 8–23)

## 2019-11-09 MED ORDER — IOHEXOL 300 MG/ML  SOLN
150.0000 mL | Freq: Once | INTRAMUSCULAR | Status: AC | PRN
  Administered 2019-11-09: 125 mL via INTRAVENOUS

## 2019-11-15 DIAGNOSIS — Z8551 Personal history of malignant neoplasm of bladder: Secondary | ICD-10-CM | POA: Diagnosis not present

## 2019-12-13 ENCOUNTER — Other Ambulatory Visit: Payer: Self-pay

## 2019-12-13 ENCOUNTER — Encounter: Payer: BC Managed Care – PPO | Admitting: Nurse Practitioner

## 2019-12-13 ENCOUNTER — Telehealth: Payer: BC Managed Care – PPO | Admitting: Medical

## 2019-12-13 ENCOUNTER — Encounter: Payer: Self-pay | Admitting: Medical

## 2019-12-13 ENCOUNTER — Ambulatory Visit: Payer: BC Managed Care – PPO | Admitting: *Deleted

## 2019-12-13 DIAGNOSIS — R059 Cough, unspecified: Secondary | ICD-10-CM

## 2019-12-13 DIAGNOSIS — Z20822 Contact with and (suspected) exposure to covid-19: Secondary | ICD-10-CM

## 2019-12-13 DIAGNOSIS — J019 Acute sinusitis, unspecified: Secondary | ICD-10-CM

## 2019-12-13 LAB — POC COVID19 BINAXNOW: SARS Coronavirus 2 Ag: NEGATIVE

## 2019-12-13 MED ORDER — AMOXICILLIN-POT CLAVULANATE 875-125 MG PO TABS: 1.0000 | ORAL_TABLET | Freq: Two times a day (BID) | ORAL | 0 refills | Status: DC

## 2019-12-13 MED ORDER — BENZONATATE 100 MG PO CAPS: ORAL_CAPSULE | ORAL | 0 refills | Status: DC

## 2019-12-13 NOTE — Progress Notes (Signed)
° °  Subjective:    Patient ID: Jennifer Hammond, female    DOB: 08/24/1955, 64 y.o.   MRN: [id number]  HPI  64 yo female in non acute distress gives consent for  telemedicine appointment.  Last week had cold symptoms( dry cough) that then resolved completely. Started on yesterday , sinus congestion , sinus headache , raspy voice, taste and smell are different.Today but can still taste.  Increased dry cough today..  No known exposure  Vaccinated Pfizer March 2021.   Allergies  Allergen Reactions   Other Nausea Only    Anesthesia--nausea    Review of Systems  Constitutional: Negative for chills and fever.  HENT: Positive for ear discharge (left ear pain ), sinus pressure (in left ear and nasal) and sinus pain. Negative for sore throat.   Respiratory: Negative for cough, shortness of breath and wheezing.   Cardiovascular: Negative for chest pain.  Gastrointestinal: Negative for abdominal pain, diarrhea, nausea and vomiting.  Genitourinary: Negative for difficulty urinating.  Musculoskeletal: Negative for neck pain.  Skin: Negative for rash.  Neurological: Positive for headaches. Negative for dizziness, syncope and light-headedness.   Green discharge from nose.    Objective:   Physical Exam AXOX3 No physcial exam performed due to telemedicine appt.  POC Covid-19 negative Recent Results (from the past 2160 hour(s))  BUN     Status: None   Collection Time: 11/09/19  8:44 AM  Result Value Ref Range   BUN 15 8 - 23 mg/dL    Comment: Performed at [facility], [address], [place], [place] [address]  Creatinine, serum     Status: None   Collection Time: 11/09/19  8:44 AM  Result Value Ref Range   Creatinine, Ser 0.66 0.44 - 1.00 mg/dL   GFR calc non Af Amer >60 >60 mL/min   GFR calc Af Amer >60 >60 mL/min    Comment: Performed at [facility], [address], [place], [place] [address]  POC COVID-19     Status: Normal   Collection Time: 12/13/19   2:59 PM  Result Value Ref Range   SARS Coronavirus 2 Ag Negative Negative    Comment: Pt aware. Virtual visit completed by H.Mercedez Boule PAC.       Assessment & Plan:  Sinusitis cough Meds ordered this encounter  Medications   amoxicillin-clavulanate (AUGMENTIN) 875-125 MG tablet    Sig: Take 1 tablet by mouth 2 (two) times daily.    Dispense:  20 tablet    Refill:  0   benzonatate (TESSALON PERLES) 100 MG capsule    Sig: Take  1-2 capsules swallow whole every 8 hours as needed for cough    Dispense:  30 capsule    Refill:  0  follow up in 3-5 days if not improving. Patient verbalizes understanding and has no questions at the end of our conversation.

## 2020-01-02 DIAGNOSIS — L439 Lichen planus, unspecified: Secondary | ICD-10-CM | POA: Diagnosis not present

## 2020-01-02 DIAGNOSIS — D485 Neoplasm of uncertain behavior of skin: Secondary | ICD-10-CM | POA: Diagnosis not present

## 2020-01-02 DIAGNOSIS — D225 Melanocytic nevi of trunk: Secondary | ICD-10-CM | POA: Diagnosis not present

## 2020-01-02 DIAGNOSIS — D2261 Melanocytic nevi of right upper limb, including shoulder: Secondary | ICD-10-CM | POA: Diagnosis not present

## 2020-01-02 DIAGNOSIS — Z85820 Personal history of malignant melanoma of skin: Secondary | ICD-10-CM | POA: Diagnosis not present

## 2020-01-02 DIAGNOSIS — L565 Disseminated superficial actinic porokeratosis (DSAP): Secondary | ICD-10-CM | POA: Diagnosis not present

## 2020-01-02 DIAGNOSIS — Z85828 Personal history of other malignant neoplasm of skin: Secondary | ICD-10-CM | POA: Diagnosis not present

## 2020-01-09 ENCOUNTER — Encounter: Payer: BC Managed Care – PPO | Admitting: Nurse Practitioner

## 2020-01-30 ENCOUNTER — Ambulatory Visit (INDEPENDENT_AMBULATORY_CARE_PROVIDER_SITE_OTHER): Payer: BC Managed Care – PPO | Admitting: Nurse Practitioner

## 2020-01-30 ENCOUNTER — Encounter: Payer: Self-pay | Admitting: Nurse Practitioner

## 2020-01-30 ENCOUNTER — Other Ambulatory Visit: Payer: Self-pay

## 2020-01-30 VITALS — BP 118/70 | Ht 63.5 in | Wt 118.0 lb

## 2020-01-30 DIAGNOSIS — N951 Menopausal and female climacteric states: Secondary | ICD-10-CM | POA: Diagnosis not present

## 2020-01-30 DIAGNOSIS — R350 Frequency of micturition: Secondary | ICD-10-CM | POA: Diagnosis not present

## 2020-01-30 DIAGNOSIS — Z01419 Encounter for gynecological examination (general) (routine) without abnormal findings: Secondary | ICD-10-CM | POA: Diagnosis not present

## 2020-01-30 NOTE — Progress Notes (Signed)
   NGOC DETJEN December 07, 1955 [id number]   History:  64 y.o. G0 presents for annual exam. 1991 TAH BSO for endometriosis. Using Estring for vaginal dryness and dyspareunia with good relief. 2019 bladder cancer with TUR, 2012 melanoma. Osteoporosis, was on Fosamax for 5 years, managed by PCP. She is having some urinary frequency. Denies dysuria, urgency, or hematuria.   Gynecologic History No LMP recorded. Patient has had a hysterectomy.   Last Pap: 2012. Results were: normal Last mammogram: 10/2019. Results were: Benign scattered calcifications in both breasts per patient Last colonoscopy: 10/2018. Results were: diverticulosis, 5 year repeat recommended Last Dexa: 07/2019. Results were: T-score -2.6, FRAX 14% / 1.5%  Past medical history, past surgical history, family history and social history were all reviewed and documented in the EPIC chart.  ROS:  A ROS was performed and pertinent positives and negatives are included.  Exam:  Vitals:   01/30/20 1535  BP: 118/70  Weight: 118 lb (53.5 kg)  Height: 5' 3.5" (1.613 m)   Body mass index is 20.57 kg/m.  General appearance:  Normal Thyroid:  Symmetrical, normal in size, without palpable masses or nodularity. Respiratory  Auscultation:  Clear without wheezing or rhonchi Cardiovascular  Auscultation:  Regular rate, without rubs, murmurs or gallops  Edema/varicosities:  Not grossly evident Abdominal  Soft,nontender, without masses, guarding or rebound.  Liver/spleen:  No organomegaly noted  Hernia:  None appreciated  Skin  Inspection:  Grossly normal   Breasts: Examined lying and sitting.   Right: Without masses, retractions, discharge or axillary adenopathy.   Left: Without masses, retractions, discharge or axillary adenopathy. Gentitourinary   Inguinal/mons:  Normal without inguinal adenopathy  External genitalia:  Normal  BUS/Urethra/Skene's glands:  Normal  Vagina:  Normal  Cervix:  Absent  Uterus:   Absent  Adnexa/parametria:     Rt: Without masses or tenderness.   Lt: Without masses or tenderness.  Anus and perineum: Normal  Assessment/Plan:  64 y.o. G0 for annual exam.   Well female exam with routine gynecological exam - Education provided on SBEs, importance of preventative screenings, current guidelines, high calcium diet, regular exercise, and multivitamin daily. Labs with PCP.   Menopausal vaginal dryness -using Estring with good relief.  Urologist aware of use and agrees for management of symptoms.  She is aware of the slight risk of systemic absorption with increasing risk for blood clots, heart attack, stroke, and breast cancer.  She would like to continue.  Refill x1 year provided.  Urinary frequency - Plan: Urinalysis w microscopic + reflex culture  Screening for cervical cancer -normal Pap history.  Last Pap in 2012.  We will repeat Pap next year at 10-year interval and then consider stopping.  She is agreeable to this.  Screening for breast cancer -most recent mammogram showed benign scattered calcifications of both breast that has been stable for years.  Breast exam normal today.  Continue annual screenings.  Screening for colon cancer -we will repeat screening colonoscopy at 5-year interval per GI recommendation.  Follow-up in 1 year for annual.    Tamela Gammon [facility], 3:48 PM 01/30/2020

## 2020-01-30 NOTE — Patient Instructions (Addendum)
Health Maintenance for Postmenopausal Women Menopause is a normal process in which your ability to get pregnant comes to an end. This process happens slowly over many months or years, usually between the ages of 48 and 55. Menopause is complete when you have missed your menstrual periods for 12 months. It is important to talk with your health care provider about some of the most common conditions that affect women after menopause (postmenopausal women). These include heart disease, cancer, and bone loss (osteoporosis). Adopting a healthy lifestyle and getting preventive care can help to promote your health and wellness. The actions you take can also lower your chances of developing some of these common conditions. What should I know about menopause? During menopause, you may get a number of symptoms, such as:  Hot flashes. These can be moderate or severe.  Night sweats.  Decrease in sex drive.  Mood swings.  Headaches.  Tiredness.  Irritability.  Memory problems.  Insomnia. Choosing to treat or not to treat these symptoms is a decision that you make with your health care provider. Do I need hormone replacement therapy?  Hormone replacement therapy is effective in treating symptoms that are caused by menopause, such as hot flashes and night sweats.  Hormone replacement carries certain risks, especially as you become older. If you are thinking about using estrogen or estrogen with progestin, discuss the benefits and risks with your health care provider. What is my risk for heart disease and stroke? The risk of heart disease, heart attack, and stroke increases as you age. One of the causes may be a change in the body's hormones during menopause. This can affect how your body uses dietary fats, triglycerides, and cholesterol. Heart attack and stroke are medical emergencies. There are many things that you can do to help prevent heart disease and stroke. Watch your blood pressure  High  blood pressure causes heart disease and increases the risk of stroke. This is more likely to develop in people who have high blood pressure readings, are of African descent, or are overweight.  Have your blood pressure checked: ? Every 3-5 years if you are 18-39 years of age. ? Every year if you are 40 years old or older. Eat a healthy diet   Eat a diet that includes plenty of vegetables, fruits, low-fat dairy products, and lean protein.  Do not eat a lot of foods that are high in solid fats, added sugars, or sodium. Get regular exercise Get regular exercise. This is one of the most important things you can do for your health. Most adults should:  Try to exercise for at least 150 minutes each week. The exercise should increase your heart rate and make you sweat (moderate-intensity exercise).  Try to do strengthening exercises at least twice each week. Do these in addition to the moderate-intensity exercise.  Spend less time sitting. Even light physical activity can be beneficial. Other tips  Work with your health care provider to achieve or maintain a healthy weight.  Do not use any products that contain nicotine or tobacco, such as cigarettes, e-cigarettes, and chewing tobacco. If you need help quitting, ask your health care provider.  Know your numbers. Ask your health care provider to check your cholesterol and your blood sugar (glucose). Continue to have your blood tested as directed by your health care provider. Do I need screening for cancer? Depending on your health history and family history, you may need to have cancer screening at different stages of your life. This   may include screening for:  Breast cancer.  Cervical cancer.  Lung cancer.  Colorectal cancer. What is my risk for osteoporosis? After menopause, you may be at increased risk for osteoporosis. Osteoporosis is a condition in which bone destruction happens more quickly than new bone creation. To help prevent  osteoporosis or the bone fractures that can happen because of osteoporosis, you may take the following actions:  If you are 23-35 years old, get at least 1,000 mg of calcium and at least 600 mg of vitamin D per day.  If you are older than age 58 but younger than age 63, get at least 1,200 mg of calcium and at least 600 mg of vitamin D per day.  If you are older than age 23, get at least 1,200 mg of calcium and at least 800 mg of vitamin D per day. Smoking and drinking excessive alcohol increase the risk of osteoporosis. Eat foods that are rich in calcium and vitamin D, and do weight-bearing exercises several times each week as directed by your health care provider. How does menopause affect my mental health? Depression may occur at any age, but it is more common as you become older. Common symptoms of depression include:  Low or sad mood.  Changes in sleep patterns.  Changes in appetite or eating patterns.  Feeling an overall lack of motivation or enjoyment of activities that you previously enjoyed.  Frequent crying spells. Talk with your health care provider if you think that you are experiencing depression. General instructions See your health care provider for regular wellness exams and vaccines. This may include:  Scheduling regular health, dental, and eye exams.  Getting and maintaining your vaccines. These include: ? Influenza vaccine. Get this vaccine each year before the flu season begins. ? Pneumonia vaccine. ? Shingles vaccine. ? Tetanus, diphtheria, and pertussis (Tdap) booster vaccine. Your health care provider may also recommend other immunizations. Tell your health care provider if you have ever been abused or do not feel safe at home. Summary  Menopause is a normal process in which your ability to get pregnant comes to an end.  This condition causes hot flashes, night sweats, decreased interest in sex, mood swings, headaches, or lack of sleep.  Treatment for this  condition may include hormone replacement therapy.  Take actions to keep yourself healthy, including exercising regularly, eating a healthy diet, watching your weight, and checking your blood pressure and blood sugar levels.  Get screened for cancer and depression. Make sure that you are up to date with all your vaccines. This information is not intended to replace advice given to you by your health care provider. Make sure you discuss any questions you have with your health care provider. Document Revised: 03/10/2018 Document Reviewed: 03/10/2018 Elsevier Patient Education  Cataract. Estradiol vaginal ring (Estring) What is this medicine? ESTRADIOL (es tra DYE ole) vaginal ring is an insert that contains a female hormone. This medicine helps relieve symptoms of vaginal irritation and dryness that occurs in some women during menopause. This medicine may be used for other purposes; ask your health care provider or pharmacist if you have questions. COMMON BRAND NAME(S): Estring What should I tell my health care provider before I take this medicine? They need to know if you have any of these conditions:  abnormal vaginal bleeding  blood vessel disease or blood clots  breast, cervical, endometrial, ovarian, liver, or uterine cancer  dementia  diabetes  gallbladder disease  heart disease or recent heart  attack  high blood pressure  high cholesterol  high level of calcium in the blood  hysterectomy  kidney disease  liver disease  migraine headaches  protein C deficiency  protein S deficiency  stroke  systemic lupus erythematosus (SLE)  tobacco smoker  an unusual or allergic reaction to estrogens, other hormones, medicines, foods, dyes, or preservatives  pregnant or trying to get pregnant  breast-feeding How should I use this medicine? This medicine may be inserted by you or your physician. Follow the directions that are included with your prescription.  If you are unsure how to insert the ring, contact your doctor or health care professional. The vaginal ring should remain in place for 90 days. After 90 days you should replace your old ring and insert a new one. Do not stop using except on the advice of your doctor or health care professional. Contact your pediatrician regarding the use of this medicine in children. Special care may be needed. A patient package insert for the product will be given with each prescription and refill. Read this sheet carefully each time. The sheet may change frequently. Overdosage: If you think you have taken too much of this medicine contact a poison control center or emergency room at once. NOTE: This medicine is only for you. Do not share this medicine with others. What if I miss a dose? If you miss a dose, use it as soon as you can. If it is almost time for your next dose, use only that dose. Do not use double or extra doses. What may interact with this medicine? Do not take this medicine with any of the following medications:  aromatase inhibitors like aminoglutethimide, anastrozole, exemestane, letrozole, testolactone, vorozole This medicine may also interact with the following medications:  carbamazepine  certain antibiotics used to treat infections  certain barbiturates used for inducing sleep or treating seizures  grapefruit juice  medicines for fungus infections like itraconazole and ketoconazole  raloxifene or tamoxifen  rifabutin, rifampin, or rifapentine  ritonavir  St. John's Wort This list may not describe all possible interactions. Give your health care provider a list of all the medicines, herbs, non-prescription drugs, or dietary supplements you use. Also tell them if you smoke, drink alcohol, or use illegal drugs. Some items may interact with your medicine. What should I watch for while using this medicine? Visit your doctor or health care professional for regular checks on your  progress. You will need a regular breast and pelvic exam and Pap smear while on this medicine. You should also discuss the need for regular mammograms with your health care professional, and follow his or her guidelines for these tests. This medicine can make your body retain fluid, making your fingers, hands, or ankles swell. Your blood pressure can go up. Contact your doctor or health care professional if you feel you are retaining fluid. If you have any reason to think you are pregnant, stop taking this medicine right away and contact your doctor or health care professional. Smoking increases the risk of getting a blood clot or having a stroke while you are taking this medicine, especially if you are more than 65 years old. You are strongly advised not to smoke. If you wear contact lenses and notice visual changes, or if the lenses begin to feel uncomfortable, consult your eye doctor or health care professional. This medicine can increase the risk of developing a condition (endometrial hyperplasia) that may lead to cancer of the lining of the uterus. Taking progestins,  another hormone drug, with this medicine lowers the risk of developing this condition. Therefore, if your uterus has not been removed (by a hysterectomy), your doctor may prescribe a progestin for you to take together with your estrogen. You should know, however, that taking estrogens with progestins may have additional health risks. You should discuss the use of estrogens and progestins with your health care professional to determine the benefits and risks for you. If you are going to have surgery, you may need to stop taking this medicine. Consult your health care professional for advice before you schedule the surgery. You may bathe or participate in other activities while using this medicine. You do not need to remove the vaginal ring during sexual or other activities unless you are more comfortable doing so. Within the 90-day dosage  period, you may remove the vaginal ring, rinse it with clean lukewarm (not hot or boiling) water, and re-insert the ring as needed. What side effects may I notice from receiving this medicine? Side effects that you should report to your doctor or health care professional as soon as possible:  allergic reactions like skin rash, itching or hives, swelling of the face, lips, or tongue  breast tissue changes or discharge  signs and symptoms of a blood clot such as breathing problems; changes in vision; chest pain; severe, sudden headache; pain, swelling, warmth in the leg; trouble speaking; sudden numbness or weakness of the face, arm or leg  signs and symptoms of infection like fever or chills; vomiting; diarrhea; muscle pain; dizziness; or a red, sunburn-like rash on face and body  signs and symptoms of liver injury like dark yellow or brown urine; general ill feeling or flu-like symptoms; light-colored stools; loss of appetite; nausea; right upper belly pain; unusually weak or tired; yellowing of the eyes or skin  symptoms of bowel blockage like constipation, abdominal swelling, abdominal pain, inability to pass gas or have a bowel movement  symptoms of vaginal infection like itching, irritation or unusual discharge  unusual or increased vaginal bleeding  vaginal pain or soreness, redness, swelling Side effects that usually do not require medical attention (report to your doctor or health care professional if they continue or are bothersome):  breast tenderness  fluid retention  hair loss  headache  nausea  upset stomach  vaginal spotting This list may not describe all possible side effects. Call your doctor for medical advice about side effects. You may report side effects to FDA at 1-800-FDA-1088. Where should I keep my medicine? Keep out of the reach of children. Store at room temperature between 15 and 25 degrees C (59 and 77 degrees F). Throw away any unused medicine after  the expiration date. NOTE: This sheet is a summary. It may not cover all possible information. If you have questions about this medicine, talk to your doctor, pharmacist, or health care provider.  2020 Elsevier/Gold Standard (2014-01-09 13:20:25)

## 2020-01-31 LAB — URINALYSIS W MICROSCOPIC + REFLEX CULTURE
Glucose, UA: NEGATIVE
Hyaline Cast: NONE SEEN /LPF

## 2020-01-31 LAB — CULTURE INDICATED

## 2020-02-01 LAB — URINALYSIS W MICROSCOPIC + REFLEX CULTURE
Bacteria, UA: NONE SEEN /HPF
Bilirubin Urine: NEGATIVE
Hgb urine dipstick: NEGATIVE
Ketones, ur: NEGATIVE
Nitrites, Initial: NEGATIVE
Protein, ur: NEGATIVE
RBC / HPF: NONE SEEN /HPF (ref 0–2)
Specific Gravity, Urine: 1.005 (ref 1.001–1.03)
pH: 7 (ref 5.0–8.0)

## 2020-02-01 LAB — URINE CULTURE
MICRO NUMBER:: 11145518
Result:: NO GROWTH
SPECIMEN QUALITY:: ADEQUATE

## 2020-04-23 DIAGNOSIS — Z8551 Personal history of malignant neoplasm of bladder: Secondary | ICD-10-CM | POA: Diagnosis not present

## 2020-04-25 ENCOUNTER — Telehealth: Payer: BC Managed Care – PPO | Admitting: Medical

## 2020-04-25 ENCOUNTER — Ambulatory Visit: Payer: BC Managed Care – PPO

## 2020-04-25 ENCOUNTER — Other Ambulatory Visit: Payer: Self-pay

## 2020-04-25 DIAGNOSIS — R059 Cough, unspecified: Secondary | ICD-10-CM

## 2020-04-25 DIAGNOSIS — R519 Headache, unspecified: Secondary | ICD-10-CM

## 2020-04-25 DIAGNOSIS — Z20822 Contact with and (suspected) exposure to covid-19: Secondary | ICD-10-CM

## 2020-04-25 LAB — POC COVID19 BINAXNOW: SARS Coronavirus 2 Ag: NEGATIVE

## 2020-04-25 NOTE — Progress Notes (Signed)
   Subjective:    Patient ID: SHONIA SKILLING, female    DOB: 1955-11-01, 65 y.o.   MRN: [id number]  HPI  65 yo female in non acute distress consents to telemedicne appointment. Yesterday with ST, raspy voice, mild cough  And HA.  [place] and boosted no close contacts known    O2 Sat 98% HR 85 Review of Systems  Constitutional: Negative for chills and fever.  HENT: Positive for sinus pressure and sore throat (scratchy). Negative for ear discharge, rhinorrhea and sneezing.   Respiratory: Positive for cough. Negative for shortness of breath.   Cardiovascular: Negative.   Gastrointestinal: Negative for abdominal pain and diarrhea.  Genitourinary: Negative for difficulty urinating.  Musculoskeletal: Negative for myalgias.  Skin: Negative for color change and rash.  Neurological: Positive for headaches. Negative for dizziness, syncope and light-headedness.   No loss of taste or smell.    Objective:   Physical Exam  AXOX3 No physical performed due to telemedicine appointment  Slight cough noted on phone call.  Results for orders placed or performed in visit on 04/25/20 (from the past 24 hour(s))  POC COVID-19     Status: Normal   Collection Time: 04/25/20  2:51 PM  Result Value Ref Range   SARS Coronavirus 2 Ag Negative Negative    PCR Pending    Assessment & Plan:  Covid screening Headache , cough, ST. Orders Placed This Encounter  Procedures  . Novel Coronavirus, NAA ([facility])    Order Specific Question:   Is this test for diagnosis or screening    Answer:   Diagnosis of ill patient    Order Specific Question:   Symptomatic for COVID-19 as defined by CDC    Answer:   Yes    Order Specific Question:   Date of Symptom Onset    Answer:   04/24/2020    Order Specific Question:   Hospitalized for COVID-19    Answer:   No    Order Specific Question:   Admitted to ICU for COVID-19    Answer:   No    Order Specific Question:   Previously tested for COVID-19     Answer:   No    Order Specific Question:   Resident in a congregate (group) care setting    Answer:   No    Order Specific Question:   Is the patient student?    Answer:   No    Order Specific Question:   Employed in healthcare setting    Answer:   No    Order Specific Question:   Pregnant    Answer:   No    Order Specific Question:   Has patient completed COVID vaccination(s) (2 doses of Pfizer/Moderna 1 dose of [employer])    Answer:   Yes    Order Specific Question:   Release to patient    Answer:   Immediate    Covid PCR Pending. If positive 04/30/20 would be return date to work date. Isolate , rest , increase fluids, OTC Motrin or Tylenol for fever or if not feeling well.  If PCR is positive may return to work on 04/30/20 if all symptoms are improving and no fever x 24 hours with no Motrin or Tylenol. If not improving to contact clinic. Patient verbalizes understanding and has no questions at the end of our conversation.

## 2020-04-25 NOTE — Patient Instructions (Signed)

## 2020-04-27 ENCOUNTER — Telehealth: Payer: BC Managed Care – PPO | Admitting: Nurse Practitioner

## 2020-04-27 ENCOUNTER — Other Ambulatory Visit: Payer: Self-pay

## 2020-04-27 LAB — SARS-COV-2, NAA 2 DAY TAT

## 2020-04-27 LAB — NOVEL CORONAVIRUS, NAA: SARS-CoV-2, NAA: DETECTED — AB

## 2020-04-27 NOTE — Progress Notes (Signed)
   Subjective:    Patient ID: Jennifer Hammond, female    DOB: 1956/01/03, 65 y.o.   MRN: [id number]  HPI  65 year old female presenting for virtual appointment today- consents to telehealth.  Patient was tested for COVID-19 on 04/25/20 resulting PCR was positive today.  Complaints mostly of fatigue today- day 4 of symptoms- today is slightly worse with fatigue.  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 Anxiety   . Cancer ([place]) 2012   MELANOMA  . Complication of anesthesia   . Endometriosis   . GERD (gastroesophageal reflux disease)   . Headache    migraines  . Heart murmur    asymptomatic  . MVP (mitral valve prolapse)    ASYMPTOMATIC-NO MEDS-DOES NOT SEE CARDIOLOGIST-LAST ECHO IN 2016  . Osteoarthritis    hands and neck  . Osteoporosis   . PONV (postoperative nausea and vomiting)    nausea only    Review of Systems  Constitutional: Positive for fatigue.  HENT: Positive for congestion.   Respiratory: Positive for cough.   Genitourinary: Negative.   Neurological: Negative.        Objective:   Physical Exam  This is a telehealth appointment after nurse visit for COVID testing.  Patient was in no acute distress during phone conversation with provider      Ref Range & Units 2 d ago  SARS-CoV-2, NAA Not Detected DetectedAbnormal           Assessment & Plan:  Discussed self care with patient  May use OTC medications for congestion and cough Maintain hydration/ rest, and assure caloric intake for appropriate nutrition.   Work note provided  May return Monday 04/30/20 if symptoms are improving will otherwise call ESW for follow up appointment  May also alert PCP of diagnosis.   Seek urgent medical attention for any acutely worsening symptoms as discussed.

## 2020-04-27 NOTE — Patient Instructions (Signed)

## 2020-04-28 ENCOUNTER — Telehealth: Payer: Self-pay | Admitting: Infectious Diseases

## 2020-04-28 NOTE — Telephone Encounter (Signed)
Called to discuss with patient about COVID-19 symptoms and the use of one of the available treatments for those with mild to moderate Covid symptoms and at a high risk of hospitalization.  Pt appears to qualify for outpatient treatment due to co-morbid conditions and/or a member of an at-risk group in accordance with the FDA Emergency Use Authorization.    Symptom onset: 1/25 - having some low grade fevers and fatigue  Vaccinated: yes Booster? Completed 04/17/20 Immunocompromised? no Qualifiers: age   She feels that she is recovering well. Politely declined any treatment. Discussed symptomatic care and isolation precautions.   Janene Madeira, NP

## 2020-04-30 ENCOUNTER — Other Ambulatory Visit: Payer: Self-pay

## 2020-04-30 ENCOUNTER — Telehealth: Payer: BC Managed Care – PPO | Admitting: Medical

## 2020-04-30 NOTE — Progress Notes (Signed)
Subjective:    Patient ID: Jennifer Hammond, female    DOB: 08-11-1955, 65 y.o.   MRN: [id number]  HPI 65 yo female whose PCR positive 04/25/2020 for Covid-19. Her POCT was negative  04/25/2020. Consents to telemedicine appointment.   She states no fever  X 24 hours with no Motrin or Tylenol being taken.  She wanted to confirm her RT work date which is  04/30/2020. Her coworkers were concerned about her returning back today so she wanted to talk to me first.  Past Medical History:  Diagnosis Date  . Anxiety   . Cancer ([place]) 2012   MELANOMA  . Complication of anesthesia   . Endometriosis   . GERD (gastroesophageal reflux disease)   . Headache    migraines  . Heart murmur    asymptomatic  . MVP (mitral valve prolapse)    ASYMPTOMATIC-NO MEDS-DOES NOT SEE CARDIOLOGIST-LAST ECHO IN 2016  . Osteoarthritis    hands and neck  . Osteoporosis   . PONV (postoperative nausea and vomiting)    nausea only    Current Outpatient Medications on File Prior to Visit  Medication Sig Dispense Refill  . ALPRAZolam (XANAX) 0.25 MG tablet Take 0.125 mg by mouth 2 (two) times daily as needed for anxiety.     . B Complex-C (B-COMPLEX WITH VITAMIN C) tablet Take 1 tablet by mouth daily at 12 noon.    . cetirizine (ZYRTEC) 10 MG tablet Take 10 mg by mouth daily as needed for allergies.    . Cholecalciferol (VITAMIN D3) 2000 units TABS Take 2,000 Units by mouth daily at 12 noon.    . Cyanocobalamin (VITAMIN B-12) 5000 MCG SUBL Take 5,000 mcg by mouth daily at 12 noon.     Marland Kitchen estradiol (ESTRING) 2 MG vaginal ring Place 2 mg vaginally every 3 (three) months. follow package directions 1 each 4  . FLUoxetine (PROZAC) 20 MG capsule Take 20 mg by mouth at bedtime.     Marland Kitchen ibuprofen (ADVIL,MOTRIN) 200 MG tablet Take 400 mg by mouth every 8 (eight) hours as needed (for pain.).    Marland Kitchen Meth-Hyo-M Bl-Na Phos-Ph Sal (URIBEL) 118 MG CAPS Take 1 capsule (118 mg total) by mouth every 6 (six) hours as needed (dysuria). 40  capsule 3  . Multiple Vitamin (MULTIVITAMIN WITH MINERALS) TABS tablet Take 1 tablet by mouth daily at 12 noon. Centrum Silver Multivitamin    . rizatriptan (MAXALT) 10 MG tablet Take 5 mg by mouth 2 (two) times daily as needed for migraine. May repeat in 2 hours if needed    . vitamin C (ASCORBIC ACID) 500 MG tablet Take 500 mg by mouth daily.     No current facility-administered medications on file prior to visit.   Allergies  Allergen Reactions  . Other Nausea Only    Anesthesia--nausea     Review of Systems  Constitutional: Positive for fatigue (improving). Negative for chills and fever.  HENT: Positive for congestion (but improving). Negative for ear discharge and sore throat.   Respiratory: Negative for cough, shortness of breath and wheezing.   Cardiovascular: Negative for chest pain.   Over all feeling better.    Objective:   Physical Exam  AXOX3 No physical exam performed due to telemedicine appointment.      Assessment & Plan:  Covid-10 positive by PCR Improving symptoms, no fever since last Friday. No Motirn or Tylenol taken. May return to work , must wear a mask x  5 days. Patient may return  to work. She verbalzies understanding is appreciative of our help, and has no questions at the end of our conversation.

## 2020-05-28 ENCOUNTER — Telehealth: Payer: BC Managed Care – PPO | Admitting: Medical

## 2020-05-28 ENCOUNTER — Other Ambulatory Visit: Payer: Self-pay

## 2020-05-28 DIAGNOSIS — J9801 Acute bronchospasm: Secondary | ICD-10-CM

## 2020-05-28 DIAGNOSIS — J069 Acute upper respiratory infection, unspecified: Secondary | ICD-10-CM

## 2020-05-28 DIAGNOSIS — R059 Cough, unspecified: Secondary | ICD-10-CM

## 2020-05-28 DIAGNOSIS — R519 Headache, unspecified: Secondary | ICD-10-CM

## 2020-05-28 MED ORDER — BENZONATATE 100 MG PO CAPS
100.0000 mg | ORAL_CAPSULE | Freq: Three times a day (TID) | ORAL | 0 refills | Status: DC | PRN
Start: 2020-05-28 — End: 2021-05-03

## 2020-05-28 MED ORDER — AZITHROMYCIN 250 MG PO TABS: ORAL_TABLET | ORAL | 0 refills | Status: DC

## 2020-05-28 MED ORDER — ALBUTEROL SULFATE HFA 108 (90 BASE) MCG/ACT IN AERS: 2.0000 | INHALATION_SPRAY | Freq: Four times a day (QID) | RESPIRATORY_TRACT | 2 refills | Status: AC | PRN

## 2020-05-28 NOTE — Progress Notes (Signed)
   Subjective:    Patient ID: Jennifer Hammond, female    DOB: 11-04-1955, 65 y.o.   MRN: [id number]  HPI 64 yo female in non acute distress, consents to telemedicine appointment. Hx to seasaonal allergies. Feels like she has a smokers cough and "I do not smoke." Nasal congestion, left sided cheek pressure, having coughing spells, Chest soreness after coughing spell yesterday, nasal congestion..   Cough started the middle of last week. Completely well after Covid-19 infection on Jan 29th. Hx of environmental allergies, noted she was more sensitive to smells like perfume, last week. Denies fever or chills, some SOB with coughing and chest tightness and wheezing. Has had to use an inhaler in the past. Current inhaler is expired.  Allergies  Allergen Reactions  . Other Nausea Only    Anesthesia--nausea   . Past Medical History:  Diagnosis Date  . Anxiety   . Cancer ([place]) 2012   MELANOMA  . Complication of anesthesia   . Endometriosis   . GERD (gastroesophageal reflux disease)   . Headache    migraines  . Heart murmur    asymptomatic  . MVP (mitral valve prolapse)    ASYMPTOMATIC-NO MEDS-DOES NOT SEE CARDIOLOGIST-LAST ECHO IN 2016  . Osteoarthritis    hands and neck  . Osteoporosis   . PONV (postoperative nausea and vomiting)    nausea only    Review of Systems  Constitutional: Negative for chills, fatigue and fever.  HENT: Positive for congestion and sinus pressure (left side nasal area/cheek ). Negative for ear pain, rhinorrhea, sinus pain and sore throat.   Respiratory: Positive for cough, chest tightness, shortness of breath (on /off) and wheezing.   Cardiovascular: Positive for chest pain (on /off last few days, soreness after coughing spell).  Gastrointestinal: Negative for abdominal pain, diarrhea and nausea.  Genitourinary: Negative for difficulty urinating.  Musculoskeletal: Negative for myalgias.  Skin: Negative for color change.  Allergic/Immunologic: Positive  for environmental allergies.  Neurological: Negative for dizziness, syncope, light-headedness and headaches.       Objective:   Physical Exam  AXOX3 Coughing noted on call. No physical performed due to telemedicine appointment.  No acute distress on phone call , no SOB noted. Speaking in complete sentences.      Assessment & Plan:  Cough, HA, ST, Bronchospasm Continue Zyrtec, restart Flonase. Meds ordered this encounter  Medications  . azithromycin (ZITHROMAX) 250 MG tablet    Sig: Take 2 tablets by mouth today then one tablet days 2-5, take with food    Dispense:  6 tablet    Refill:  0  . albuterol (VENTOLIN HFA) 108 (90 Base) MCG/ACT inhaler    Sig: Inhale 2 puffs into the lungs every 6 (six) hours as needed for wheezing or shortness of breath (or cough as needed.).    Dispense:  8 g    Refill:  2  . benzonatate (TESSALON PERLES) 100 MG capsule    Sig: Take 1 capsule (100 mg total) by mouth 3 (three) times daily as needed. Do not take with methotrexate    Dispense:  30 capsule    Refill:  0  Isolate x 24 hours then may return to work if no fever ( and no Motrin or Tylenol being taken.) to wear mask. If not improving in 3-5 days or if worsening to call the office. May need CXR. Patient verbalizes understanding and has no questions at the end of our conversation.

## 2020-06-05 DIAGNOSIS — K219 Gastro-esophageal reflux disease without esophagitis: Secondary | ICD-10-CM | POA: Diagnosis not present

## 2020-06-05 DIAGNOSIS — R0789 Other chest pain: Secondary | ICD-10-CM | POA: Diagnosis not present

## 2020-06-05 DIAGNOSIS — R0600 Dyspnea, unspecified: Secondary | ICD-10-CM | POA: Diagnosis not present

## 2020-06-18 ENCOUNTER — Telehealth: Payer: BC Managed Care – PPO | Admitting: Medical

## 2020-06-22 IMAGING — CT CT ABDOMEN AND PELVIS WITHOUT AND WITH CONTRAST
3 of 12 series · 12 of 46 positions shown, 18 images · IV contrast (omnipaque)
Comparison: 09/18/2017.

CLINICAL DATA: High-grade bladder cancer treated with surgery and
chemotherapy. Melanoma.

EXAM:
CT ABDOMEN AND PELVIS WITHOUT AND WITH CONTRAST
TECHNIQUE: Multidetector CT imaging of the abdomen and pelvis was performed
following the standard protocol before and following the bolus
administration of intravenous contrast.
CONTRAST:  80mL OMNIPAQUE IOHEXOL 300 MG/ML  SOLN

[Series 2: axial without pre · axial · non-contrast · 0.63mm/px · z∈[-1478,-1373]mm · 3 of 85 slices shown]
[im 11/85  soft-tissue]
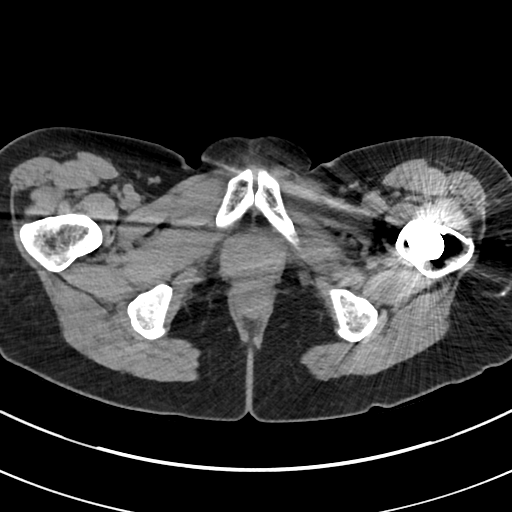
[im 22/85  soft-tissue]
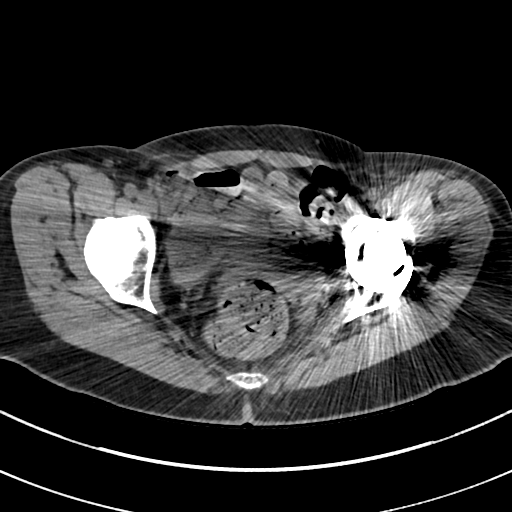
[im 32/85  soft-tissue]
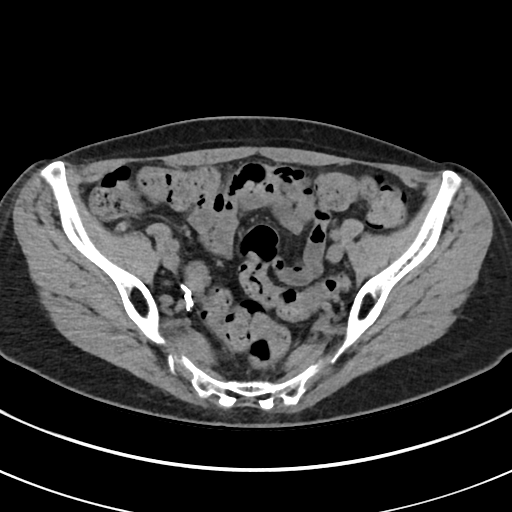

[Series 17: axial delay prone · axial · delayed · 0.68mm/px · z∈[-1537,-1207]mm · 7 of 88 slices shown, 12 images]
[im 11/88  soft-tissue]
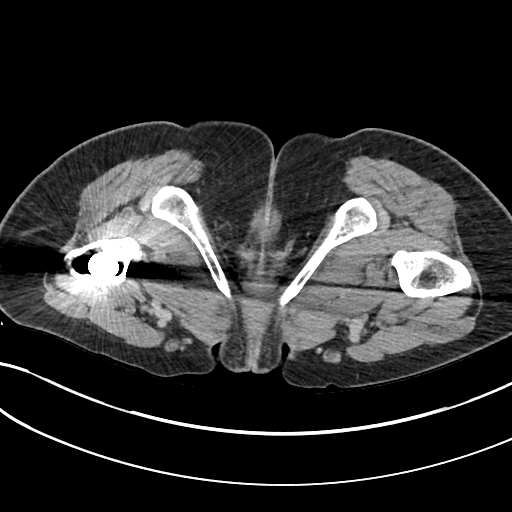
[im 11/88  bone]
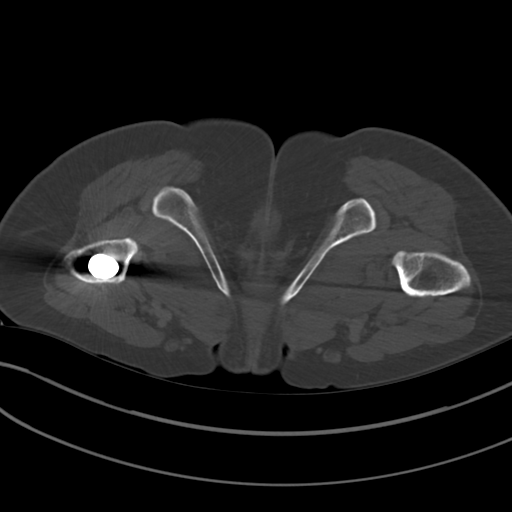
[im 22/88  soft-tissue]
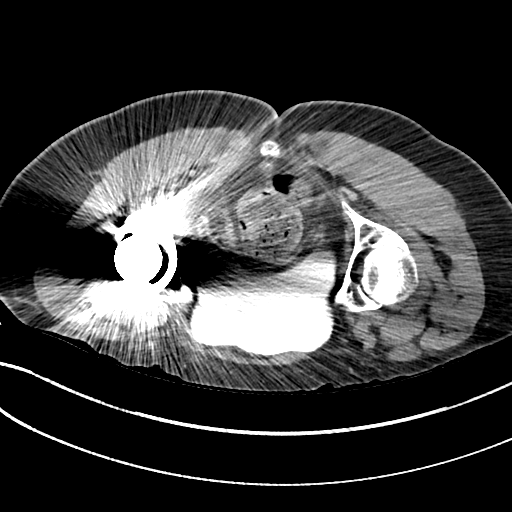
[im 33/88  soft-tissue]
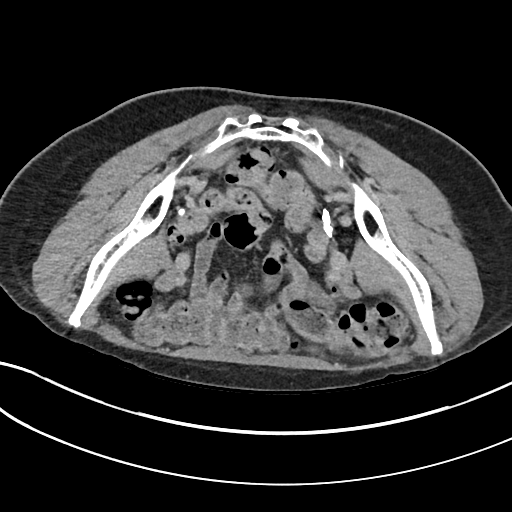
[im 44/88  soft-tissue]
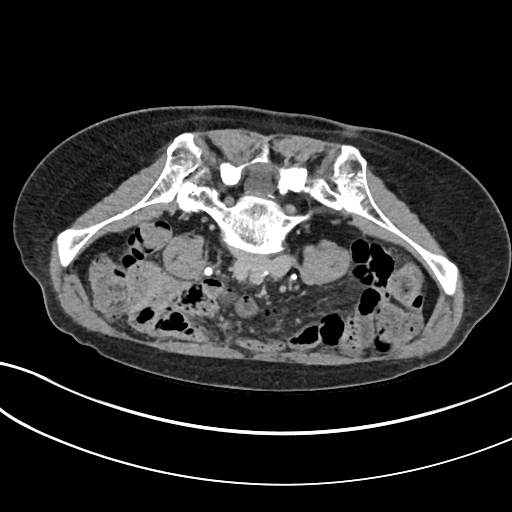
[im 44/88  lung]
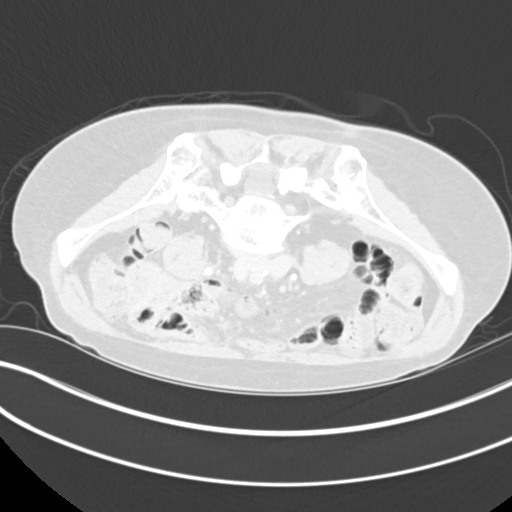
[im 55/88  soft-tissue]
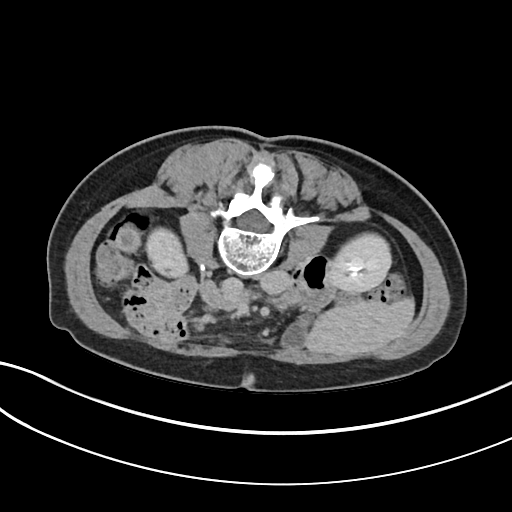
[im 55/88  lung]
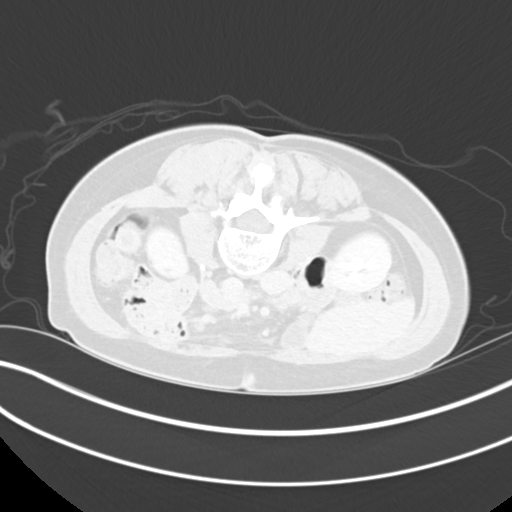
[im 66/88  soft-tissue]
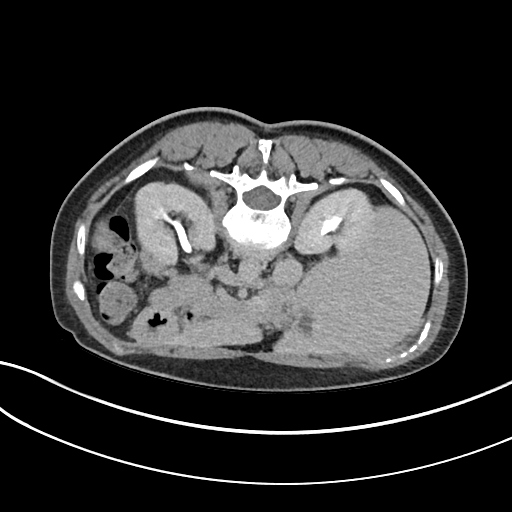
[im 66/88  lung]
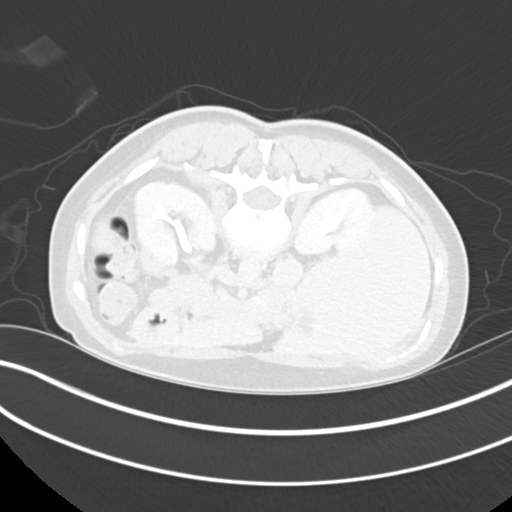
[im 77/88  soft-tissue]
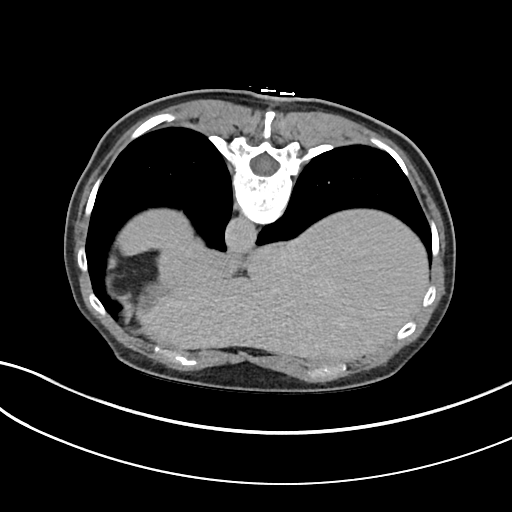
[im 77/88  lung]
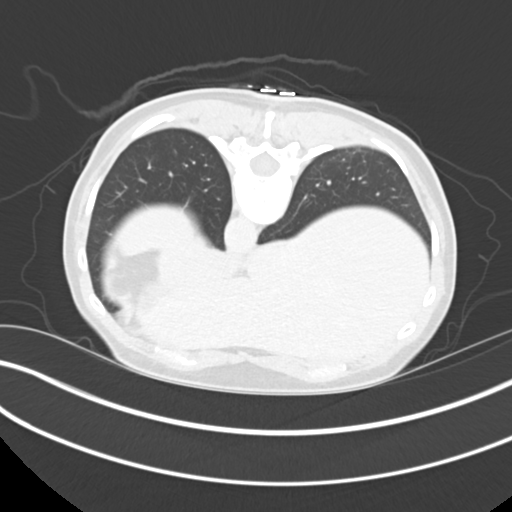

[Series 20: coronal delay prone · coronal · delayed · 0.68mm/px · 2 of 162 slices shown, 3 images]
[im 54/162  soft-tissue]
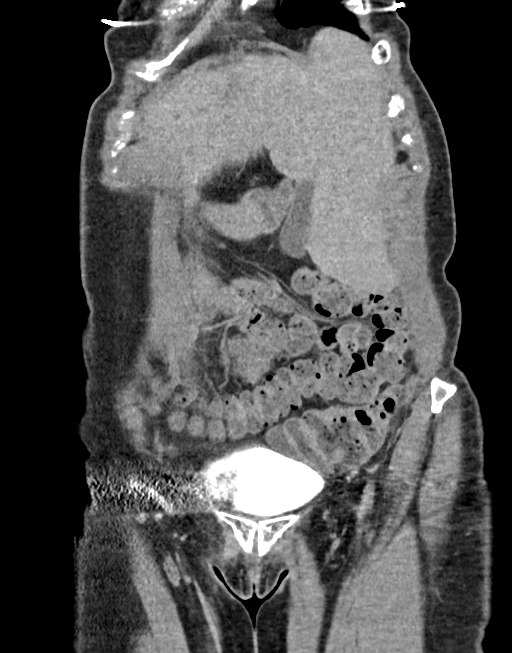
[im 54/162  bone]
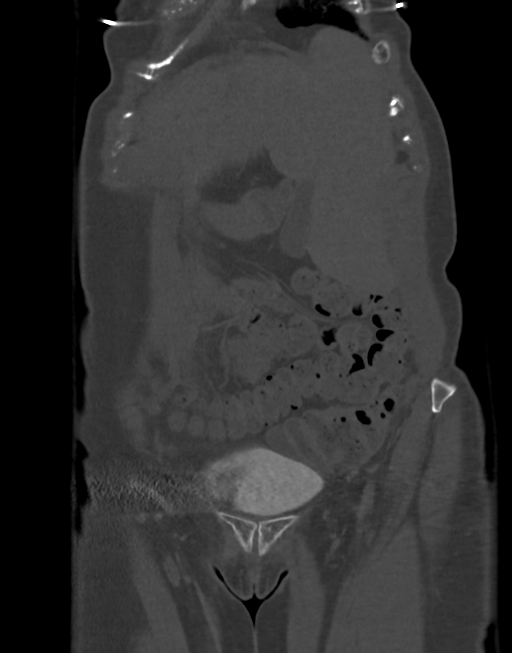
[im 108/162  soft-tissue]
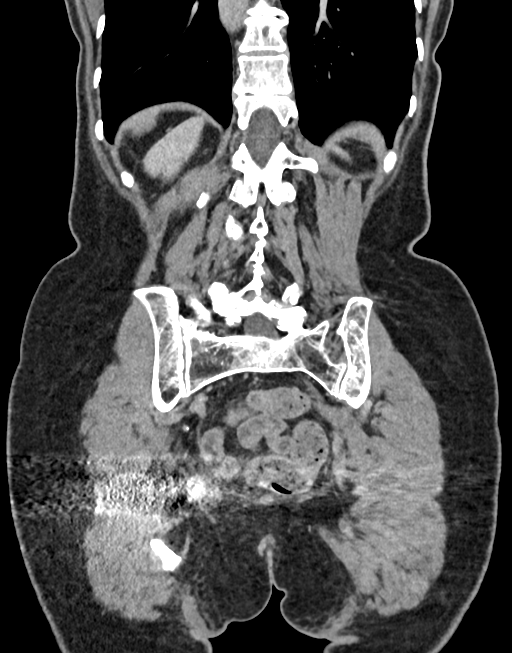

[12 of 46 positions shown; findings below may reference images not displayed]

FINDINGS: Lower chest: Lung bases are clear. Heart size normal. No pericardial
or pleural effusion.

Hepatobiliary: Subcentimeter low-attenuation lesions in the liver
are too small to characterize. Liver and gallbladder are otherwise
unremarkable. No biliary ductal dilatation.

Pancreas: Negative.

Spleen: Negative.

Adrenals/Urinary Tract: Adrenal glands are unremarkable. No urinary
stones. Right pelvicaliectasis. Right kidney is otherwise
unremarkable. Subcentimeter low-attenuation lesions in the left
kidney are too small to characterize but statistically, cysts are
likely. Proximal right ureter is poorly opacified, limiting
evaluation. Otherwise, no filling defects in the visualized portions
of the intrarenal collecting systems, ureters and bladder. Note is
made that the bladder is somewhat obscured by streak artifact from a
left hip arthroplasty.

Stomach/Bowel: Stomach, small bowel and appendix are unremarkable.
Stool is seen throughout the colon, indicative of constipation.

Vascular/Lymphatic: Vascular structures are unremarkable. No
pathologically enlarged lymph nodes.

Reproductive: Hysterectomy.  No adnexal mass.

Other: No free fluid. Surgical clips in the right adnexa.
Mesenteries and peritoneum are unremarkable.

Musculoskeletal: Left hip arthroplasty. No worrisome lytic or
sclerotic lesions. Degenerative changes in the facet joints in the
lower lumbar spine. Mild avascular necrosis in the right femoral
head.
IMPRESSION: 1. Bladder is partially obscured by streak artifact from a left hip
arthroplasty. No definite evidence of recurrent or metastatic
disease.
2. Mildly avascular necrosis in the right femoral head.

## 2020-06-25 ENCOUNTER — Telehealth: Payer: BC Managed Care – PPO | Admitting: Medical

## 2020-07-02 DIAGNOSIS — X32XXXA Exposure to sunlight, initial encounter: Secondary | ICD-10-CM | POA: Diagnosis not present

## 2020-07-02 DIAGNOSIS — Z86006 Personal history of melanoma in-situ: Secondary | ICD-10-CM | POA: Diagnosis not present

## 2020-07-02 DIAGNOSIS — L570 Actinic keratosis: Secondary | ICD-10-CM | POA: Diagnosis not present

## 2020-07-02 DIAGNOSIS — D225 Melanocytic nevi of trunk: Secondary | ICD-10-CM | POA: Diagnosis not present

## 2020-07-02 DIAGNOSIS — Z85828 Personal history of other malignant neoplasm of skin: Secondary | ICD-10-CM | POA: Diagnosis not present

## 2020-07-02 DIAGNOSIS — Z85820 Personal history of malignant melanoma of skin: Secondary | ICD-10-CM | POA: Diagnosis not present

## 2020-07-17 ENCOUNTER — Other Ambulatory Visit: Payer: Self-pay

## 2020-07-17 ENCOUNTER — Ambulatory Visit: Payer: BC Managed Care – PPO

## 2020-07-17 ENCOUNTER — Telehealth: Payer: BC Managed Care – PPO | Admitting: Medical

## 2020-07-17 DIAGNOSIS — R059 Cough, unspecified: Secondary | ICD-10-CM

## 2020-07-17 DIAGNOSIS — J029 Acute pharyngitis, unspecified: Secondary | ICD-10-CM

## 2020-07-17 DIAGNOSIS — R0982 Postnasal drip: Secondary | ICD-10-CM

## 2020-07-17 DIAGNOSIS — J3489 Other specified disorders of nose and nasal sinuses: Secondary | ICD-10-CM

## 2020-07-17 NOTE — Progress Notes (Signed)
   Subjective:    Patient ID: Jennifer Hammond, female    DOB: 1955/06/03, 65 y.o.   MRN: [id number]  HPI 65 yo female in non acute distress consents to telemedicine appointment.Stareted last Wenesday itchy throat and sneezing taking Zyrtec x 30 days. Starting Thursday had head congestion, runny nose, last night cough non productive today fatigued. Patient with Covid on Apr 28, 2020.  Vaccinated   Pfizer and not boosted Review of Systems  HENT: Positive for congestion, postnasal drip, rhinorrhea, sinus pressure, sinus pain, sneezing and sore throat.   Respiratory: Positive for cough and shortness of breath ("very mild"). Negative for chest tightness and wheezing.   Cardiovascular: Negative for chest pain.   Positve for Covid-19 on Apr 28, 2020.    Objective:   Physical Exam  AXOX3 No physical performed due to telemedicine appointment. clearing throat on phone call..     Assessment & Plan:  Runny nose , cough, mild shortness of breath. PND Rest , increase fluids .OTC Motrin or Tylenol per package instructions for pain or fever. Dilute salt water gargles  3-4 times /day. Cannot test patient because she tested less than 90 days with Covid-19.She is to take  5 days off from start of symptoms with first day Day 0, return to work on  07/18/20 as long as no fever the last 24 hours and all other symptoms are improving.;She is to wear a mask for the next  5 days.  If worsening to contact this office.  Patient verbalizes understanding and has no questions at the end of our  conversation.

## 2020-07-23 DIAGNOSIS — Z131 Encounter for screening for diabetes mellitus: Secondary | ICD-10-CM | POA: Diagnosis not present

## 2020-07-23 DIAGNOSIS — Z0000 Encounter for general adult medical examination without abnormal findings: Secondary | ICD-10-CM | POA: Diagnosis not present

## 2020-07-23 DIAGNOSIS — Z1389 Encounter for screening for other disorder: Secondary | ICD-10-CM | POA: Diagnosis not present

## 2020-07-23 DIAGNOSIS — Z13220 Encounter for screening for lipoid disorders: Secondary | ICD-10-CM | POA: Diagnosis not present

## 2020-07-30 DIAGNOSIS — C689 Malignant neoplasm of urinary organ, unspecified: Secondary | ICD-10-CM | POA: Diagnosis not present

## 2020-07-30 DIAGNOSIS — Z0000 Encounter for general adult medical examination without abnormal findings: Secondary | ICD-10-CM | POA: Diagnosis not present

## 2020-07-30 DIAGNOSIS — E559 Vitamin D deficiency, unspecified: Secondary | ICD-10-CM | POA: Diagnosis not present

## 2020-10-15 ENCOUNTER — Other Ambulatory Visit: Payer: Self-pay | Admitting: Urology

## 2020-10-15 DIAGNOSIS — Z8551 Personal history of malignant neoplasm of bladder: Secondary | ICD-10-CM | POA: Diagnosis not present

## 2020-10-15 DIAGNOSIS — R3121 Asymptomatic microscopic hematuria: Secondary | ICD-10-CM

## 2020-10-15 DIAGNOSIS — Z1231 Encounter for screening mammogram for malignant neoplasm of breast: Secondary | ICD-10-CM | POA: Diagnosis not present

## 2020-11-06 ENCOUNTER — Other Ambulatory Visit: Payer: Self-pay

## 2020-11-06 ENCOUNTER — Ambulatory Visit
Admission: RE | Admit: 2020-11-06 | Discharge: 2020-11-06 | Disposition: A | Discharge status: Home / Self Care | Payer: BC Managed Care – PPO | Source: Ambulatory Visit | Attending: Urology | Admitting: Urology

## 2020-11-06 DIAGNOSIS — C679 Malignant neoplasm of bladder, unspecified: Secondary | ICD-10-CM | POA: Diagnosis not present

## 2020-11-06 DIAGNOSIS — Z8551 Personal history of malignant neoplasm of bladder: Secondary | ICD-10-CM | POA: Insufficient documentation

## 2020-11-06 DIAGNOSIS — R3121 Asymptomatic microscopic hematuria: Secondary | ICD-10-CM | POA: Diagnosis not present

## 2020-11-06 DIAGNOSIS — Z90710 Acquired absence of both cervix and uterus: Secondary | ICD-10-CM | POA: Diagnosis not present

## 2020-11-06 DIAGNOSIS — Z96642 Presence of left artificial hip joint: Secondary | ICD-10-CM | POA: Diagnosis not present

## 2020-11-06 LAB — POCT I-STAT CREATININE: Creatinine, Ser: 0.7 mg/dL (ref 0.44–1.00)

## 2020-11-06 MED ORDER — IOHEXOL 350 MG/ML SOLN
100.0000 mL | Freq: Once | INTRAVENOUS | Status: AC | PRN
  Administered 2020-11-06: 100 mL via INTRAVENOUS

## 2020-11-09 DIAGNOSIS — Z8551 Personal history of malignant neoplasm of bladder: Secondary | ICD-10-CM | POA: Diagnosis not present

## 2020-11-20 ENCOUNTER — Other Ambulatory Visit: Payer: Self-pay | Admitting: Nurse Practitioner

## 2020-11-20 DIAGNOSIS — N951 Menopausal and female climacteric states: Secondary | ICD-10-CM

## 2020-11-20 NOTE — Telephone Encounter (Signed)
AEX 01/30/20

## 2020-11-29 DIAGNOSIS — L821 Other seborrheic keratosis: Secondary | ICD-10-CM | POA: Diagnosis not present

## 2020-11-29 DIAGNOSIS — D485 Neoplasm of uncertain behavior of skin: Secondary | ICD-10-CM | POA: Diagnosis not present

## 2020-11-29 DIAGNOSIS — B078 Other viral warts: Secondary | ICD-10-CM | POA: Diagnosis not present

## 2020-11-29 DIAGNOSIS — D1801 Hemangioma of skin and subcutaneous tissue: Secondary | ICD-10-CM | POA: Diagnosis not present

## 2020-11-29 DIAGNOSIS — X32XXXA Exposure to sunlight, initial encounter: Secondary | ICD-10-CM | POA: Diagnosis not present

## 2020-11-29 DIAGNOSIS — L814 Other melanin hyperpigmentation: Secondary | ICD-10-CM | POA: Diagnosis not present

## 2020-12-21 DIAGNOSIS — Z23 Encounter for immunization: Secondary | ICD-10-CM | POA: Diagnosis not present

## 2021-01-01 DIAGNOSIS — Z23 Encounter for immunization: Secondary | ICD-10-CM | POA: Diagnosis not present

## 2021-02-16 ENCOUNTER — Other Ambulatory Visit: Payer: Self-pay | Admitting: Nurse Practitioner

## 2021-02-16 DIAGNOSIS — N951 Menopausal and female climacteric states: Secondary | ICD-10-CM

## 2021-02-25 DIAGNOSIS — R300 Dysuria: Secondary | ICD-10-CM | POA: Diagnosis not present

## 2021-02-25 DIAGNOSIS — R310 Gross hematuria: Secondary | ICD-10-CM | POA: Diagnosis not present

## 2021-02-25 DIAGNOSIS — Z8551 Personal history of malignant neoplasm of bladder: Secondary | ICD-10-CM | POA: Diagnosis not present

## 2021-03-02 ENCOUNTER — Other Ambulatory Visit: Payer: Self-pay | Admitting: Nurse Practitioner

## 2021-03-02 DIAGNOSIS — N951 Menopausal and female climacteric states: Secondary | ICD-10-CM

## 2021-03-04 NOTE — Telephone Encounter (Signed)
AEX scheduled 04/09/21. Last AEX 01/31/20.

## 2021-03-12 DIAGNOSIS — N952 Postmenopausal atrophic vaginitis: Secondary | ICD-10-CM | POA: Diagnosis not present

## 2021-03-12 DIAGNOSIS — B370 Candidal stomatitis: Secondary | ICD-10-CM | POA: Diagnosis not present

## 2021-03-12 DIAGNOSIS — N390 Urinary tract infection, site not specified: Secondary | ICD-10-CM | POA: Diagnosis not present

## 2021-04-04 DIAGNOSIS — D2272 Melanocytic nevi of left lower limb, including hip: Secondary | ICD-10-CM | POA: Diagnosis not present

## 2021-04-04 DIAGNOSIS — Z85828 Personal history of other malignant neoplasm of skin: Secondary | ICD-10-CM | POA: Diagnosis not present

## 2021-04-04 DIAGNOSIS — X32XXXA Exposure to sunlight, initial encounter: Secondary | ICD-10-CM | POA: Diagnosis not present

## 2021-04-04 DIAGNOSIS — L570 Actinic keratosis: Secondary | ICD-10-CM | POA: Diagnosis not present

## 2021-04-04 DIAGNOSIS — D225 Melanocytic nevi of trunk: Secondary | ICD-10-CM | POA: Diagnosis not present

## 2021-04-04 DIAGNOSIS — Z85820 Personal history of malignant melanoma of skin: Secondary | ICD-10-CM | POA: Diagnosis not present

## 2021-04-09 ENCOUNTER — Ambulatory Visit: Payer: BC Managed Care – PPO | Admitting: Nurse Practitioner

## 2021-04-15 DIAGNOSIS — R896 Abnormal cytological findings in specimens from other organs, systems and tissues: Secondary | ICD-10-CM | POA: Diagnosis not present

## 2021-04-15 DIAGNOSIS — N952 Postmenopausal atrophic vaginitis: Secondary | ICD-10-CM | POA: Diagnosis not present

## 2021-04-15 DIAGNOSIS — N3020 Other chronic cystitis without hematuria: Secondary | ICD-10-CM | POA: Diagnosis not present

## 2021-04-15 DIAGNOSIS — Z8551 Personal history of malignant neoplasm of bladder: Secondary | ICD-10-CM | POA: Diagnosis not present

## 2021-04-22 ENCOUNTER — Ambulatory Visit: Payer: BC Managed Care – PPO | Admitting: Nurse Practitioner

## 2021-04-23 DIAGNOSIS — Z8551 Personal history of malignant neoplasm of bladder: Secondary | ICD-10-CM | POA: Diagnosis not present

## 2021-04-23 DIAGNOSIS — N3020 Other chronic cystitis without hematuria: Secondary | ICD-10-CM | POA: Diagnosis not present

## 2021-04-23 DIAGNOSIS — N952 Postmenopausal atrophic vaginitis: Secondary | ICD-10-CM | POA: Diagnosis not present

## 2021-04-25 ENCOUNTER — Ambulatory Visit: Payer: BC Managed Care – PPO | Admitting: Nurse Practitioner

## 2021-05-03 ENCOUNTER — Encounter: Payer: Self-pay | Admitting: Nurse Practitioner

## 2021-05-03 ENCOUNTER — Other Ambulatory Visit: Payer: Self-pay

## 2021-05-03 ENCOUNTER — Ambulatory Visit (INDEPENDENT_AMBULATORY_CARE_PROVIDER_SITE_OTHER): Payer: BC Managed Care – PPO | Admitting: Nurse Practitioner

## 2021-05-03 VITALS — BP 124/84 | Ht 63.0 in | Wt 124.0 lb

## 2021-05-03 DIAGNOSIS — M810 Age-related osteoporosis without current pathological fracture: Secondary | ICD-10-CM | POA: Diagnosis not present

## 2021-05-03 DIAGNOSIS — Z01419 Encounter for gynecological examination (general) (routine) without abnormal findings: Secondary | ICD-10-CM | POA: Diagnosis not present

## 2021-05-03 DIAGNOSIS — N952 Postmenopausal atrophic vaginitis: Secondary | ICD-10-CM | POA: Diagnosis not present

## 2021-05-03 DIAGNOSIS — Z780 Asymptomatic menopausal state: Secondary | ICD-10-CM

## 2021-05-03 MED ORDER — ESTRING 2 MG VA RING: 2.0000 mg | VAGINAL_RING | VAGINAL | 2 refills | Status: DC

## 2021-05-03 NOTE — Progress Notes (Signed)
Jennifer Hammond 06-06-1955 [id number]   History:  66 y.o. G0 presents for annual exam. Postmenopausal - using Estring for vaginal dryness and dyspareunia with good relief. She stopped Estring when diagnosed with bladder cancer in 2019, but due to discomfort and failed treatments, urology was comfortable with restarting. A few months ago she was experiencing a small amount of blood with wiping and thought it was urinary. Urology found it to be vaginal d/t atrophy/dryness and prescribed vaginal estradiol cream to apply externally. Symptoms have improved.  S/P 1991 TAH BSO for endometriosis. 2019 bladder cancer with TUR, 2012 melanoma. Osteoporosis, was on Fosamax for 5 years, managed by PCP.   Gynecologic History No LMP recorded. Patient has had a hysterectomy.   Sexually active: No  Health maintenance Last Pap: 2012. Results were: Normal Last mammogram: 10/15/2020. Results were: Normal Last colonoscopy: 10/2018. Results were: diverticulosis, 5-year repeat recommended Last Dexa: 07/2019. Results were: T-score -2.6, FRAX 14% / 1.5%  Past medical history, past surgical history, family history and social history were all reviewed and documented in the EPIC chart. Married. Works at [employer]. Daughter lives near [place]. 2 grandsons ages 62 and 40.   ROS:  A ROS was performed and pertinent positives and negatives are included.  Exam:  Vitals:   05/03/21 1501  BP: 124/84  Weight: 124 lb (56.2 kg)  Height: 5\' 3"  (1.6 m)    Body mass index is 21.97 kg/m.  General appearance:  Normal Thyroid:  Symmetrical, normal in size, without palpable masses or nodularity. Respiratory  Auscultation:  Clear without wheezing or rhonchi Cardiovascular  Auscultation:  Regular rate, without rubs, murmurs or gallops  Edema/varicosities:  Not grossly evident Abdominal  Soft,nontender, without masses, guarding or rebound.  Liver/spleen:  No organomegaly noted  Hernia:  None appreciated  Skin  Inspection:   Grossly normal   Breasts: Examined lying and sitting.   Right: Without masses, retractions, discharge or axillary adenopathy.   Left: Without masses, retractions, discharge or axillary adenopathy. Genitourinary   Inguinal/mons:  Normal without inguinal adenopathy  External genitalia:  Normal appearing vulva with no masses, tenderness, or lesions. Atrophic changes  BUS/Urethra/Skene's glands:  Normal  Vagina:  Normal appearing with normal color and discharge, no lesions. Atrophic changes  Cervix: Absent  Uterus:  Absent  Adnexa/parametria:     Rt: Normal in size, without masses or tenderness.   Lt: Normal in size, without masses or tenderness.  Anus and perineum: Normal  Digital rectal exam: Normal sphincter tone without palpated masses or tenderness  Patient informed chaperone available to be present for breast and pelvic exam. Patient has requested no chaperone to be present. Patient has been advised what will be completed during breast and pelvic exam.   Assessment/Plan:  66 y.o. G0 for annual exam.   Well female exam with routine gynecological exam - Education provided on SBEs, importance of preventative screenings, current guidelines, high calcium diet, regular exercise, and multivitamin daily. Labs with PCP.   Postmenopausal - S/P 1991 TAH BSO for endometriosis.   Vaginal atrophy - Plan: estradiol (ESTRING) 2 MG vaginal ring with good relief. Urologist aware of use and agrees for management of symptoms.  She is aware of the slight risk of systemic absorption with increasing risk for blood clots, heart attack, stroke, and breast cancer.  She would like to continue.  Refill x1 year provided. She is also using vaginal estrogen cream externally. We also discussed coconut oil daily to help with hydration.   Age-related osteoporosis  without current pathological fracture - osteoporosis managed by PCP. Was on Fosamax in the past. Continue Vitamin D + Calcium and regular exercise.    Screening for cervical cancer - Normal Pap history. No longer screening per guidelines.   Screening for breast cancer - Normal mammogram history.  Breast exam normal today.  Continue annual screenings.  Screening for colon cancer - 2020 colonoscopy. Will repeat at 5-year interval per guidelines.   Follow-up in 1 year for annual.    Tamela Gammon [facility], 3:18 PM 05/03/2021

## 2021-06-07 DIAGNOSIS — N390 Urinary tract infection, site not specified: Secondary | ICD-10-CM | POA: Diagnosis not present

## 2021-06-07 DIAGNOSIS — R300 Dysuria: Secondary | ICD-10-CM | POA: Diagnosis not present

## 2021-06-10 DIAGNOSIS — R300 Dysuria: Secondary | ICD-10-CM | POA: Diagnosis not present

## 2021-06-12 DIAGNOSIS — Z8551 Personal history of malignant neoplasm of bladder: Secondary | ICD-10-CM | POA: Diagnosis not present

## 2021-06-12 DIAGNOSIS — N3289 Other specified disorders of bladder: Secondary | ICD-10-CM | POA: Diagnosis not present

## 2021-06-17 ENCOUNTER — Encounter: Payer: Self-pay | Admitting: Nurse Practitioner

## 2021-06-17 ENCOUNTER — Other Ambulatory Visit: Payer: Self-pay

## 2021-06-17 ENCOUNTER — Ambulatory Visit: Payer: BC Managed Care – PPO | Admitting: Nurse Practitioner

## 2021-06-17 VITALS — BP 122/78

## 2021-06-17 DIAGNOSIS — N952 Postmenopausal atrophic vaginitis: Secondary | ICD-10-CM

## 2021-06-17 NOTE — Progress Notes (Signed)
? ?  Acute Office Visit ? ?Subjective:  ? ? Patient ID: Jennifer Hammond, female    DOB: 1956/01/14, 66 y.o.   MRN: [id number] ? ? ?HPI ?66 y.o. presents today for vaginal spotting. It was very light and occurred for a few days a couple of weeks ago around the time she was on an antibiotic for URI. She had urinalysis done at that time by urology. Using Estring and estradiol cream externally for vaginal dryness/atrophic vaginitis. She was due for her Estring exchange at that time and bleeding stopped after that. Also using coconut oil. Denies pain or discharge. S/P 1991 TAH BSO. Not sexually active.  ? ? ?Review of Systems  ?Constitutional: Negative.   ?Genitourinary:  Positive for vaginal bleeding (Light spotting). Negative for dysuria, genital sores, vaginal discharge and vaginal pain.  ? ?   ?Objective:  ?  ?Physical Exam ?Constitutional:   ?   Appearance: Normal appearance.  ?Genitourinary: ?   General: Normal vulva.  ?   Vagina: No signs of injury. No vaginal discharge, erythema, tenderness or bleeding.  ?   Comments: No evidence of bleeding. Atrophic changes.  ? ? ?BP 122/78  ?Wt Readings from Last 3 Encounters:  ?05/03/21 124 lb (56.2 kg)  ?01/30/20 118 lb (53.5 kg)  ?12/07/18 121 lb (54.9 kg)  ? ? ?   ? ?Patient informed chaperone available to be present for breast and pelvic exam. Patient has requested no chaperone to be present. Patient has been advised what will be completed during breast and pelvic exam.  ? ?Assessment & Plan:  ? ?Problem List Items Addressed This Visit   ?None ?Visit Diagnoses   ? ? Vaginal atrophy    -  Primary  ? ?  ? ?Plan: No evidence of bleeding on exam today. Recommend continuing Estring, external vaginal estrogen and coconut oil. If bleeding returns she will return to office.  ? ? ? ? ?[place]i [place], 4:13 PM 06/17/2021 ? ?

## 2021-06-19 DIAGNOSIS — N3289 Other specified disorders of bladder: Secondary | ICD-10-CM | POA: Diagnosis not present

## 2021-06-19 DIAGNOSIS — Z8551 Personal history of malignant neoplasm of bladder: Secondary | ICD-10-CM | POA: Diagnosis not present

## 2021-06-30 DIAGNOSIS — N390 Urinary tract infection, site not specified: Secondary | ICD-10-CM | POA: Diagnosis not present

## 2021-06-30 DIAGNOSIS — Z8551 Personal history of malignant neoplasm of bladder: Secondary | ICD-10-CM | POA: Diagnosis not present

## 2021-07-01 DIAGNOSIS — Z8551 Personal history of malignant neoplasm of bladder: Secondary | ICD-10-CM | POA: Diagnosis not present

## 2021-07-01 DIAGNOSIS — N390 Urinary tract infection, site not specified: Secondary | ICD-10-CM | POA: Diagnosis not present

## 2021-07-12 NOTE — H&P (Signed)
NAMEJalyn, Hammond Jennifer H. ?MEDICAL RECORD NO: [id number] ?ACCOUNT NO: [id number] ?DATE OF BIRTH: 01/10/1956 ?FACILITY: [facility] ?LOCATION: [facility] ?PHYSICIAN: Otelia Limes. Yves Dill, MD ? ?History and Physical  ? ?DATE OF ADMISSION: 07/18/2021 ? ?CHIEF COMPLAINT:  Abnormal urine cytology. ? ?HISTORY OF PRESENT ILLNESS:  The patient is a 66 year old white female with history of high-grade superficial bladder cancer that was treated with transurethral resection of bladder tumor in 2019 followed by a 1-year course of intravesical mitomycin.   ?She also underwent rebiopsy of her tumor site in 2020, which was benign.  More recently, she has had several cytologies and they revealed dysplasia.  The most recent cytology was a FISH study, which was positive for abnormal urothelial cells associated  ?with a neoplastic karyotype.  She comes in now for cystoscopy with bladder biopsy and bilateral retrogrades.  Most recent upper urinary tract evaluation with CT scan was performed in August of 2022 which was unremarkable.  Most recent cystoscopy on  ?04/15/2021 was also unremarkable. ? ?PAST MEDICAL HISTORY:  NO DRUG ALLERGIES. ? ?CURRENT MEDICATIONS:  Prozac, vitamin D, vitamin B, multivitamin, zinc, magnesium and vitamin C. ? ?PAST SURGICAL HISTORY:  ?1.  Hysterectomy 1991 due to endometriosis. ?2.  Eyelid surgery 2011. ?3.  Left hip replacement 2016. ?4.  Repair of a fractured left patella, 2018. ?5.  Transurethral resection of bladder tumor in 2019. ?6.  Cystoscopy with bladder biopsy, 2020. ? ?PAST AND CURRENT MEDICAL CONDITIONS: ?1.  GERD. ?2.  Migraine headaches. ?3.  Osteoporosis. ?4.  Hyperlipidemia. ?5.  History of basal cell carcinoma and melanoma of the chest wall, status post removal in 2012 and 2019. ? ?REVIEW OF SYSTEMS:  The patient has allergic rhinitis and decreased visual acuity.  She denies chest pain, shortness of breath, diabetes, stroke or heart disease. ? ?FAMILY HISTORY:  Father died at age 37 of a stroke, but had a  history of bladder cancer prior to that.  Mother is living, age 24 without cancer.  Her mother does have a history of kidney stones. ? ?PHYSICAL EXAMINATION:   ?VITAL SIGNS:  Height was 5 feet 3 inches, weight 122 pounds, BMI 22. ?GENERAL:  A well-nourished white female in no acute distress. ?HEENT:  Sclerae were clear.  Pupils are equally round, reactive to light and accommodation.  Extraocular movements intact. ?NECK:  No palpable masses or tenderness. ?PULMONARY:  Lungs are clear to auscultation. ?CARDIOVASCULAR:  Regular rhythm and rate without audible murmurs. ?ABDOMEN:  Soft, nontender abdomen.  No CVA tenderness. ?GENITOURINARY AND RECTAL: Exam deferred. ?NEUROMUSCULAR:  Alert and oriented x3. ? ?IMPRESSION:  ?1.  Urine dysplasia. ?2.  History of high-grade superficial bladder cancer. ? ?PLAN: Cystoscopy with bilateral retrograde and bladder biopsy. ? ? ?SHY ?D: 07/12/2021 12:14:48 pm T: 07/12/2021 12:37:00 pm  ?JOB: [id number]/ [id number]  ?

## 2021-07-17 ENCOUNTER — Other Ambulatory Visit: Payer: Self-pay

## 2021-07-17 ENCOUNTER — Other Ambulatory Visit
Admission: RE | Admit: 2021-07-17 | Discharge: 2021-07-17 | Disposition: A | Discharge status: Home / Self Care | Payer: BC Managed Care – PPO | Source: Ambulatory Visit | Attending: Urology | Admitting: Urology

## 2021-07-17 ENCOUNTER — Encounter
Admission: RE | Admit: 2021-07-17 | Discharge: 2021-07-17 | Disposition: A | Discharge status: Home / Self Care | Payer: BC Managed Care – PPO | Source: Ambulatory Visit | Attending: Urology | Admitting: Urology

## 2021-07-17 DIAGNOSIS — Z6822 Body mass index (BMI) 22.0-22.9, adult: Secondary | ICD-10-CM | POA: Diagnosis not present

## 2021-07-17 DIAGNOSIS — E669 Obesity, unspecified: Secondary | ICD-10-CM | POA: Diagnosis not present

## 2021-07-17 DIAGNOSIS — R8290 Unspecified abnormal findings in urine: Secondary | ICD-10-CM | POA: Diagnosis not present

## 2021-07-17 DIAGNOSIS — K219 Gastro-esophageal reflux disease without esophagitis: Secondary | ICD-10-CM | POA: Diagnosis not present

## 2021-07-17 DIAGNOSIS — Z01810 Encounter for preprocedural cardiovascular examination: Secondary | ICD-10-CM | POA: Insufficient documentation

## 2021-07-17 DIAGNOSIS — Z8551 Personal history of malignant neoplasm of bladder: Secondary | ICD-10-CM | POA: Diagnosis not present

## 2021-07-17 DIAGNOSIS — R310 Gross hematuria: Secondary | ICD-10-CM | POA: Diagnosis not present

## 2021-07-17 NOTE — Patient Instructions (Addendum)
Your procedure is scheduled on: Thursday 07/18/21 ?Report to the Registration Desk on the 1st floor of the [facility]. ?To find out your arrival time, please call [phone] between 1PM - 3PM on: Wednesday 07/17/21 ? ?REMEMBER: ?Instructions that are not followed completely may result in serious medical risk, up to and including death; or upon the discretion of your surgeon and anesthesiologist your surgery may need to be rescheduled. ? ?Do not eat or drink after midnight the night before surgery.  ?No gum chewing, lozengers or hard candies. ? ?TAKE THESE MEDICATIONS THE MORNING OF SURGERY WITH A SIP OF WATER: ?ALPRAZolam (XANAX) 0.25 MG tablet if needed ? ? ?pantoprazole (PROTONIX) 40 MG tablet (take one the night before and one on the morning of surgery - helps to prevent nausea after surgery.) ? ?One week prior to surgery: ?Stop Anti-inflammatories (NSAIDS) such as Advil, Aleve, Ibuprofen, Motrin, Naproxen, Naprosyn and Aspirin based products such as Excedrin, Goodys Powder, BC Powder. ? ?Stop taking your B Complex-C (B-COMPLEX WITH VITAMIN C) tablet, Cholecalciferol (VITAMIN D3) 2000 units TABS, Magnesium 250 MG TABS, Multiple Vitamin (MULTIVITAMIN WITH MINERALS) TABS tablet, vitamin C (ASCORBIC ACID) 500 MG tablet, zinc gluconate 50 MG tablet, ANY OVER THE COUNTER supplements until after surgery. ? ?You may however, continue to take Tylenol if needed for pain up until the day of surgery. ? ?No Alcohol for 24 hours before or after surgery. ? ?No Smoking including e-cigarettes for 24 hours prior to surgery.  ?No chewable tobacco products for at least 6 hours prior to surgery.  ?No nicotine patches on the day of surgery. ? ?Do not use any "recreational" drugs for at least a week prior to your surgery.  ?Please be advised that the combination of cocaine and anesthesia may have negative outcomes, up to and including death. ?If you test positive for cocaine, your surgery will be cancelled. ? ?On the morning of  surgery brush your teeth with toothpaste and water, you may rinse your mouth with mouthwash if you wish. ?Do not swallow any toothpaste or mouthwash. ? ?Do not wear jewelry, make-up, hairpins, clips or nail polish. ? ?Do not wear lotions, powders, or perfumes.  ? ?Do not shave body from the neck down 48 hours prior to surgery just in case you cut yourself which could leave a site for infection.  ? ?Do not bring valuables to the hospital. [facility] is not responsible for any missing/lost belongings or valuables.  ? ?Notify your doctor if there is any change in your medical condition (cold, fever, infection). ? ?Wear comfortable clothing (specific to your surgery type) to the hospital. ? ?If you are being discharged the day of surgery, you will not be allowed to drive home. ?You will need a responsible adult (18 years or older) to drive you home and stay with you that night.  ? ?If you are taking public transportation, you will need to have a responsible adult (18 years or older) with you. ?Please confirm with your physician that it is acceptable to use public transportation.  ? ?Please call the [facility] Dept. at [phone] if you have any questions about these instructions. ? ?Surgery Visitation Policy: ? ?Patients undergoing a surgery or procedure may have two family members or support persons with them as long as the person is not COVID-19 positive or experiencing its symptoms.  ? ?Inpatient Visitation:   ? ?Visiting hours are 7 a.m. to 8 p.m. ?Up to four visitors are allowed at one time in  a patient room, including children. The visitors may rotate out with other people during the day. One designated support person (adult) may remain overnight.  ?

## 2021-07-18 ENCOUNTER — Ambulatory Visit: Payer: BC Managed Care – PPO

## 2021-07-18 ENCOUNTER — Ambulatory Visit
Admission: RE | Admit: 2021-07-18 | Discharge: 2021-07-18 | Disposition: A | Discharge status: Home / Self Care | Payer: BC Managed Care – PPO | Attending: Urology | Admitting: Urology

## 2021-07-18 ENCOUNTER — Encounter
Admission: RE | Disposition: A | Discharge status: Home / Self Care | Payer: Self-pay | Source: Home / Self Care | Attending: Urology

## 2021-07-18 ENCOUNTER — Ambulatory Visit: Payer: BC Managed Care – PPO | Admitting: Anesthesiology

## 2021-07-18 ENCOUNTER — Encounter: Payer: Self-pay | Admitting: Urology

## 2021-07-18 ENCOUNTER — Other Ambulatory Visit: Payer: Self-pay

## 2021-07-18 DIAGNOSIS — R8290 Unspecified abnormal findings in urine: Secondary | ICD-10-CM | POA: Diagnosis not present

## 2021-07-18 DIAGNOSIS — R310 Gross hematuria: Secondary | ICD-10-CM | POA: Diagnosis not present

## 2021-07-18 DIAGNOSIS — E669 Obesity, unspecified: Secondary | ICD-10-CM | POA: Diagnosis not present

## 2021-07-18 DIAGNOSIS — N329 Bladder disorder, unspecified: Secondary | ICD-10-CM | POA: Diagnosis not present

## 2021-07-18 DIAGNOSIS — Z8551 Personal history of malignant neoplasm of bladder: Secondary | ICD-10-CM | POA: Insufficient documentation

## 2021-07-18 DIAGNOSIS — R338 Other retention of urine: Secondary | ICD-10-CM | POA: Diagnosis not present

## 2021-07-18 DIAGNOSIS — K219 Gastro-esophageal reflux disease without esophagitis: Secondary | ICD-10-CM | POA: Diagnosis not present

## 2021-07-18 DIAGNOSIS — Z6822 Body mass index (BMI) 22.0-22.9, adult: Secondary | ICD-10-CM | POA: Diagnosis not present

## 2021-07-18 DIAGNOSIS — C678 Malignant neoplasm of overlapping sites of bladder: Secondary | ICD-10-CM | POA: Diagnosis not present

## 2021-07-18 HISTORY — PX: CYSTOSCOPY WITH BIOPSY: SHX5122

## 2021-07-18 HISTORY — PX: CYSTOSCOPY W/ RETROGRADES: SHX1426

## 2021-07-18 SURGERY — CYSTOSCOPY, WITH BIOPSY
Anesthesia: General

## 2021-07-18 MED ORDER — LIDOCAINE HCL URETHRAL/MUCOSAL 2 % EX GEL
CUTANEOUS | Status: DC | PRN
  Administered 2021-07-18: 1

## 2021-07-18 MED ORDER — CHLORHEXIDINE GLUCONATE 0.12 % MT SOLN
15.0000 mL | Freq: Once | OROMUCOSAL | Status: AC
  Administered 2021-07-18: 15 mL via OROMUCOSAL

## 2021-07-18 MED ORDER — LACTATED RINGERS IV SOLN
INTRAVENOUS | Status: DC
Start: 2021-07-18 — End: 2021-07-18

## 2021-07-18 MED ORDER — PHENYLEPHRINE HCL (PRESSORS) 10 MG/ML IV SOLN
INTRAVENOUS | Status: DC | PRN
  Administered 2021-07-18 (×2): 80 ug via INTRAVENOUS

## 2021-07-18 MED ORDER — FENTANYL CITRATE (PF) 100 MCG/2ML IJ SOLN: 25.0000 ug | INTRAMUSCULAR | Status: DC | PRN

## 2021-07-18 MED ORDER — PROPOFOL 500 MG/50ML IV EMUL
INTRAVENOUS | Status: AC
  Filled 2021-07-18: qty 150

## 2021-07-18 MED ORDER — URIBEL 118 MG PO CAPS: 1.0000 | ORAL_CAPSULE | Freq: Four times a day (QID) | ORAL | 3 refills | Status: DC | PRN

## 2021-07-18 MED ORDER — DEXAMETHASONE SODIUM PHOSPHATE 10 MG/ML IJ SOLN
INTRAMUSCULAR | Status: DC | PRN
Start: 2021-07-18 — End: 2021-07-18
  Administered 2021-07-18: 8 mg via INTRAVENOUS

## 2021-07-18 MED ORDER — GLYCOPYRROLATE 0.2 MG/ML IJ SOLN
INTRAMUSCULAR | Status: DC | PRN
Start: 2021-07-18 — End: 2021-07-18
  Administered 2021-07-18: .2 mg via INTRAVENOUS

## 2021-07-18 MED ORDER — APREPITANT 40 MG PO CAPS
40.0000 mg | ORAL_CAPSULE | Freq: Once | ORAL | Status: AC
  Administered 2021-07-18: 40 mg via ORAL

## 2021-07-18 MED ORDER — ORAL CARE MOUTH RINSE: 15.0000 mL | Freq: Once | OROMUCOSAL | Status: AC

## 2021-07-18 MED ORDER — LIDOCAINE HCL URETHRAL/MUCOSAL 2 % EX GEL
CUTANEOUS | Status: AC
  Filled 2021-07-18: qty 10

## 2021-07-18 MED ORDER — SODIUM CHLORIDE 0.9 % IR SOLN
Status: DC | PRN
Start: 2021-07-18 — End: 2021-07-18
  Administered 2021-07-18: 800 mL via INTRAVESICAL

## 2021-07-18 MED ORDER — MIDAZOLAM HCL 2 MG/2ML IJ SOLN
INTRAMUSCULAR | Status: DC | PRN
  Administered 2021-07-18: 2 mg via INTRAVENOUS

## 2021-07-18 MED ORDER — OXYCODONE HCL 5 MG/5ML PO SOLN: 5.0000 mg | Freq: Once | ORAL | Status: DC | PRN

## 2021-07-18 MED ORDER — SULFAMETHOXAZOLE-TRIMETHOPRIM 800-160 MG PO TABS: 1.0000 | ORAL_TABLET | Freq: Two times a day (BID) | ORAL | 0 refills | Status: DC

## 2021-07-18 MED ORDER — LIDOCAINE HCL (CARDIAC) PF 100 MG/5ML IV SOSY
PREFILLED_SYRINGE | INTRAVENOUS | Status: DC | PRN
  Administered 2021-07-18: 60 mg via INTRAVENOUS

## 2021-07-18 MED ORDER — APREPITANT 40 MG PO CAPS
ORAL_CAPSULE | ORAL | Status: AC
  Filled 2021-07-18: qty 1

## 2021-07-18 MED ORDER — IOHEXOL 180 MG/ML  SOLN
INTRAMUSCULAR | Status: DC | PRN
  Administered 2021-07-18: 15 mL

## 2021-07-18 MED ORDER — MIDAZOLAM HCL 2 MG/2ML IJ SOLN
INTRAMUSCULAR | Status: AC
  Filled 2021-07-18: qty 2

## 2021-07-18 MED ORDER — ONDANSETRON HCL 4 MG/2ML IJ SOLN
INTRAMUSCULAR | Status: DC | PRN
  Administered 2021-07-18: 4 mg via INTRAVENOUS

## 2021-07-18 MED ORDER — FENTANYL CITRATE (PF) 100 MCG/2ML IJ SOLN
INTRAMUSCULAR | Status: DC | PRN
  Administered 2021-07-18 (×4): 25 ug via INTRAVENOUS

## 2021-07-18 MED ORDER — OXYCODONE HCL 5 MG PO TABS: 5.0000 mg | ORAL_TABLET | Freq: Once | ORAL | Status: DC | PRN

## 2021-07-18 MED ORDER — PROPOFOL 10 MG/ML IV BOLUS
INTRAVENOUS | Status: DC | PRN
  Administered 2021-07-18: 150 mg via INTRAVENOUS

## 2021-07-18 MED ORDER — CEFAZOLIN SODIUM-DEXTROSE 2-3 GM-%(50ML) IV SOLR
INTRAVENOUS | Status: DC | PRN
  Administered 2021-07-18: 2 g via INTRAVENOUS

## 2021-07-18 MED ORDER — CHLORHEXIDINE GLUCONATE 0.12 % MT SOLN
OROMUCOSAL | Status: AC
  Filled 2021-07-18: qty 15

## 2021-07-18 MED ORDER — FENTANYL CITRATE (PF) 100 MCG/2ML IJ SOLN
INTRAMUSCULAR | Status: AC
  Filled 2021-07-18: qty 2

## 2021-07-18 MED ORDER — WATER FOR IRRIGATION, STERILE IR SOLN
Status: DC | PRN
Start: 2021-07-18 — End: 2021-07-18
  Administered 2021-07-18: 1000 mL

## 2021-07-18 MED ORDER — PROMETHAZINE HCL 25 MG/ML IJ SOLN: 6.2500 mg | INTRAMUSCULAR | Status: DC | PRN

## 2021-07-18 MED ORDER — ACETAMINOPHEN 10 MG/ML IV SOLN: 1000.0000 mg | Freq: Once | INTRAVENOUS | Status: DC | PRN

## 2021-07-18 MED ORDER — EPHEDRINE SULFATE (PRESSORS) 50 MG/ML IJ SOLN
INTRAMUSCULAR | Status: DC | PRN
  Administered 2021-07-18 (×2): 5 mg via INTRAVENOUS

## 2021-07-18 SURGICAL SUPPLY — 26 items
BAG DRAIN CYSTO-URO LG1000N (MISCELLANEOUS) ×3 IMPLANT
CATH FOL LX CONE TIP  8F (CATHETERS) ×2
CATH FOL LX CONE TIP 8F (CATHETERS) IMPLANT
CATH URETL 5X70 OPEN END (CATHETERS) ×1 IMPLANT
CATH URETL OPEN 5X70 (CATHETERS) ×3 IMPLANT
DRSG TELFA 4X3 1S NADH ST (GAUZE/BANDAGES/DRESSINGS) ×3 IMPLANT
ELECT REM PT RETURN 9FT ADLT (ELECTROSURGICAL) ×2
ELECTRODE REM PT RTRN 9FT ADLT (ELECTROSURGICAL) ×2 IMPLANT
GAUZE 4X4 16PLY ~~LOC~~+RFID DBL (SPONGE) ×5 IMPLANT
GLOVE SURG ENC MOIS LTX SZ7.5 (GLOVE) ×3 IMPLANT
GOWN STRL REUS W/ TWL LRG LVL3 (GOWN DISPOSABLE) ×2 IMPLANT
GOWN STRL REUS W/ TWL LRG LVL4 (GOWN DISPOSABLE) ×2 IMPLANT
GOWN STRL REUS W/ TWL XL LVL3 (GOWN DISPOSABLE) ×2 IMPLANT
GOWN STRL REUS W/TWL LRG LVL3 (GOWN DISPOSABLE) ×2
GOWN STRL REUS W/TWL LRG LVL4 (GOWN DISPOSABLE) ×2
GOWN STRL REUS W/TWL XL LVL3 (GOWN DISPOSABLE) ×2
IV NS IRRIG 3000ML ARTHROMATIC (IV SOLUTION) ×3 IMPLANT
KIT TURNOVER CYSTO (KITS) ×3 IMPLANT
MANIFOLD NEPTUNE II (INSTRUMENTS) ×3 IMPLANT
PACK CYSTO AR (MISCELLANEOUS) ×3 IMPLANT
SET CYSTO W/LG BORE CLAMP LF (SET/KITS/TRAYS/PACK) ×3 IMPLANT
SET IRRIG Y TYPE TUR BLADDER L (SET/KITS/TRAYS/PACK) ×3 IMPLANT
SURGILUBE 2OZ TUBE FLIPTOP (MISCELLANEOUS) ×3 IMPLANT
WATER STERILE IRR 1000ML POUR (IV SOLUTION) ×3 IMPLANT
WATER STERILE IRR 3000ML UROMA (IV SOLUTION) ×3 IMPLANT
WATER STERILE IRR 500ML POUR (IV SOLUTION) ×3 IMPLANT

## 2021-07-18 NOTE — Op Note (Signed)
Preoperative diagnosis: 1.  Hematuria (R31.0) ?                                          2.  History of bladder cancer (Z85.51) ?                                          3.  Abnormal urine cytology (R82.8) ? ?Postoperative diagnosis: Same ? ?Procedure: 1.  Bladder biopsies (CPT [phone]) ?                    2.  Cystoscopy with bilateral retrograde pyelography (cpt 52005) ? ?Surgeon: Otelia Limes. Yves Dill MD ? ?Anesthesia: General ? ?Indications:See the history and physical also. 66 year old (DATE OF BIRTH: 1955-09-08) white female with history of high-grade superficial bladder cancer that was treated with transurethral resection of bladder tumor in 2019 followed by a 1-year course of intravesical mitomycin.  She also underwent rebiopsy of her tumor site in 2020, which was benign.  More recently, she has had several cytologies and they revealed dysplasia.  The most recent cytology was a FISH study, which was positive for abnormal urothelial cells associated with a neoplastic karyotype.  She comes in now for cystoscopy with bladder biopsy and bilateral retrogrades.  Most recent upper urinary tract evaluation with CT scan was performed in August of 2022 which was unremarkable.  Most recent office cystoscopy on 04/15/2021 was also unremarkable. After informed consent the above procedure(s) were requested ? ?   ?Technique and findings: After adequate general anesthesia been obtained the patient was placed into dorsal lithotomy position and the perineum was prepped and draped in the usual fashion.  34 French cystoscope was coupled with a camera and placed into the bladder.  The bladder was thoroughly inspected.  Both ureteral orifices were identified and had clear efflux.  No bladder tumors were identified.  At this point a 6 [place] cone-tip ureteral catheter was placed into the right ureteral orifice and retrograde pyelography was performed.  No hydronephrosis, filling defects or abnormalities were identified.  The ureter and  collecting structures drained properly.  Retrograde pyelography was repeated on the left side in identical fashion.  No hydronephrosis, filling defects or abnormalities were identified.  The left ureter and collecting structures drained properly.  At this point cold cup procedures were performed at the posterior wall both lateral walls and anterior bladder wall.  Biopsy sites were cauterized with the Bugbee electrode.  Inspection of the bladder after drainage revealed multiple areas of glomerulations.  Bladder capacity was noted to be 500 cc.  The cystoscope was then removed and 10 cc of viscous Xylocaine instilled within the urethra and the bladder.  The procedure was then terminated and patient transferred to the recovery room in stable condition.  ? ?  ? ?  ?

## 2021-07-18 NOTE — Discharge Instructions (Addendum)
Bladder Biopsy, Care After The following information offers guidance on how to care for yourself after your procedure. Your health care provider may also give you more specific instructions. If you have problems or questions, contact your health care provider. What can I expect after the procedure? After the procedure, it is common to have: Mild pain and burning in your bladder or kidney area when you urinate. Small amounts of blood in your urine. A sudden urge to urinate. A need to urinate more often than usual. Follow these instructions at home: Medicines Take over-the-counter and prescription medicines only as told by your health care provider. If you were prescribed an antibiotic medicine, take it as told by your health care provider. Do not stop using the antibiotic even if you start to feel better. Activity Rest as told by your health care provider. If you were given a sedative during the procedure, it can affect you for several hours. Do not drive or operate machinery until your health care provider says that it is safe. Return to your normal activities as told by your health care provider. Ask your health care provider what activities are safe for you. General instructions A bathtub filled with water.   Take a warm bath to relieve any burning feeling around your urethra. Hold a warm, damp washcloth over the urethral area to ease pain. It is up to you to get the results of your procedure. Ask your health care provider, or the department that is doing the procedure, when your results will be ready. Keep all follow-up visits. This is important. Contact a health care provider if: Your symptoms do not improve within 24 hours, and you keep having: Burning when you urinate. More blood in your urine. Pain when you urinate. An urgent need to urinate. A need to urinate more often than usual. Get help right away if: You have a fever. You have a lot of blood in your urine. You have  bright red blood in your urine. You are passing blood clots in your urine. You have severe pain. You cannot urinate. Summary After the procedure, it is common to have mild pain in your bladder or kidney area, mild burning with urination, and some blood. Take over-the-counter and prescription medicines only as told by your health care provider. If you were prescribed an antibiotic medicine, do not stop using the antibiotic even if you start to feel better. Rest after the procedure. Follow instructions from your health care provider for home care. Get help right away if you have a lot of blood, bright red blood, or blood clots in your urine, severe pain, or fever. This information is not intended to replace advice given to you by your health care provider. Make sure you discuss any questions you have with your health care provider. Document Revised: 02/24/2021 Document Reviewed: 02/24/2021 Elsevier Patient Education  2023 Elsevier Inc.   AMBULATORY SURGERY  DISCHARGE INSTRUCTIONS   The drugs that you were given will stay in your system until tomorrow so for the next 24 hours you should not:  Drive an automobile Make any legal decisions Drink any alcoholic beverage   You may resume regular meals tomorrow.  Today it is better to start with liquids and gradually work up to solid foods.  You may eat anything you prefer, but it is better to start with liquids, then soup and crackers, and gradually work up to solid foods.   Please notify your doctor immediately if you have any unusual bleeding,   trouble breathing, redness and pain at the surgery site, drainage, fever, or pain not relieved by medication.    Additional Instructions:  Please contact your physician with any problems or Same Day Surgery at [phone], Monday through Friday 6 am to 4 pm, or [place] [facility] [place] Main number at [phone].  

## 2021-07-18 NOTE — H&P (Signed)
Date of Initial H&P: 07/12/21 ? ?History reviewed, patient examined, no change in status, stable for surgery. ?

## 2021-07-18 NOTE — Anesthesia Procedure Notes (Signed)
Procedure Name: LMA Insertion ?Date/Time: 07/18/2021 7:37 AM ?Performed by: Lerry Liner, CRNA ?Pre-anesthesia Checklist: Patient identified, Emergency Drugs available, Suction available and Patient being monitored ?Patient Re-evaluated:Patient Re-evaluated prior to induction ?Oxygen Delivery Method: Circle system utilized ?Preoxygenation: Pre-oxygenation with 100% oxygen ?Induction Type: IV induction ?Ventilation: Mask ventilation without difficulty ?LMA: LMA inserted ?LMA Size: 3.0 ?Tube type: Oral ?Number of attempts: 1 ?Placement Confirmation: positive ETCO2 and breath sounds checked- equal and bilateral ?Tube secured with: Tape ?Dental Injury: Teeth and Oropharynx as per pre-operative assessment  ? ? ? ? ?

## 2021-07-18 NOTE — Anesthesia Preprocedure Evaluation (Addendum)
Anesthesia Evaluation  ?Patient identified by MRN, date of birth, ID band ?Patient awake ? ? ? ?Reviewed: ?Allergy & Precautions, NPO status , Patient's Chart, lab work & pertinent test results ? ?History of Anesthesia Complications ?(+) PONV and history of anesthetic complications ? ?Airway ?Mallampati: II ? ?TM Distance: >3 FB ?Neck ROM: Full ? ? ? Dental ?no notable dental hx. ? ?  ?Pulmonary ?neg pulmonary ROS, neg sleep apnea, neg COPD,  ?  ?breath sounds clear to auscultation- rhonchi ?(-) wheezing ? ? ? ? ? Cardiovascular ?Exercise Tolerance: Good ?(-) hypertension(-) CAD, (-) Past MI, (-) Cardiac Stents and (-) CABG Valvular problems/murmurs: MVP.  ?Rhythm:Regular Rate:Normal ?- Systolic murmurs and - Diastolic murmurs ? ?  ?Neuro/Psych ? Headaches, neg Seizures Anxiety   ? GI/Hepatic ?Neg liver ROS, GERD  Controlled,  ?Endo/Other  ?negative endocrine ROSneg diabetes ? Renal/GU ?negative Renal ROS  ? ?  ?Musculoskeletal ? ?(+) Arthritis ,  ? Abdominal ?(+) - obese,   ?Peds ? Hematology ?negative hematology ROS ?(+)   ?Anesthesia Other Findings ?Past Medical History: ?No date: Anxiety ?2012: Cancer [facility]) ?    Comment:  MELANOMA ?No date: Complication of anesthesia ?No date: Endometriosis ?No date: GERD (gastroesophageal reflux disease) ?No date: Headache ?    Comment:  migraines ?No date: Heart murmur ?    Comment:  asymptomatic ?No date: MVP (mitral valve prolapse) ?    Comment:  ASYMPTOMATIC-NO MEDS-DOES NOT SEE CARDIOLOGIST-LAST ECHO ?             IN 2016 ?No date: Osteoarthritis ?    Comment:  hands and neck ?No date: Osteoporosis ?No date: PONV (postoperative nausea and vomiting) ?    Comment:  nausea only ? ? Reproductive/Obstetrics ? ?  ? ? ? ? ? ? ? ? ? ? ? ? ? ?  ?  ? ? ? ? ? ? ? ?Anesthesia Physical ? ?Anesthesia Plan ? ?ASA: II ? ?Anesthesia Plan: General  ? ?Post-op Pain Management: Minimal or no pain anticipated  ? ?Induction: Intravenous ? ?PONV Risk Score and  Plan: 3 and Dexamethasone, Midazolam and Ondansetron ? ?Airway Management Planned: LMA ? ?Additional Equipment:  ? ?Intra-op Plan:  ? ?Post-operative Plan: Extubation in OR ? ?Informed Consent: I have reviewed the patients History and Physical, chart, labs and discussed the procedure including the risks, benefits and alternatives for the proposed anesthesia with the patient or authorized representative who has indicated his/her understanding and acceptance.  ? ? ? ?Dental advisory given ? ?Plan Discussed with: CRNA and Anesthesiologist ? ?Anesthesia Plan Comments:   ? ? ? ? ? ?Anesthesia Quick Evaluation ? ?

## 2021-07-18 NOTE — Transfer of Care (Signed)
Immediate Anesthesia Transfer of Care Note ? ?Patient: Jennifer Hammond ? ?Procedure(s) Performed: CYSTOSCOPY WITH BIOPSY ?CYSTOSCOPY WITH RETROGRADE PYELOGRAM ? ?Patient Location: PACU ? ?Anesthesia Type:General ? ?Level of Consciousness: awake ? ?Airway & Oxygen Therapy: Patient Spontanous Breathing and Patient connected to face mask oxygen ? ?Post-op Assessment: Report given to RN ? ?Post vital signs: stable ? ?Last Vitals:  ?Vitals Value Taken Time  ?BP 110/58 07/18/21 0840  ?Temp 36.3 ?C 07/18/21 0840  ?Pulse 81 07/18/21 0841  ?Resp 13 07/18/21 0841  ?SpO2 100 % 07/18/21 0841  ?Vitals shown include unvalidated device data. ? ?Last Pain:  ?Vitals:  ? 07/18/21 0840  ?TempSrc:   ?PainSc: Asleep  ?   ? ?  ? ?Complications: No notable events documented. ?

## 2021-07-18 NOTE — Anesthesia Postprocedure Evaluation (Signed)
Anesthesia Post Note ? ?Patient: HAILEY STORMER ? ?Procedure(s) Performed: CYSTOSCOPY WITH BIOPSY ?CYSTOSCOPY WITH RETROGRADE PYELOGRAM ? ?Patient location during evaluation: PACU ?Anesthesia Type: General ?Level of consciousness: awake and alert ?Pain management: pain level controlled ?Vital Signs Assessment: post-procedure vital signs reviewed and stable ?Respiratory status: spontaneous breathing, nonlabored ventilation and respiratory function stable ?Cardiovascular status: blood pressure returned to baseline and stable ?Postop Assessment: no apparent nausea or vomiting ?Anesthetic complications: no ? ? ?No notable events documented. ? ? ?Last Vitals:  ?Vitals:  ? 07/18/21 0858 07/18/21 0910  ?BP: (!) 118/45 116/66  ?Pulse: 83 66  ?Resp: 17 16  ?Temp: (!) 36.4 ?C 37 ?C  ?SpO2: 97% 100%  ?  ?Last Pain:  ?Vitals:  ? 07/18/21 0910  ?TempSrc: Temporal  ?PainSc: 0-No pain  ? ? ?  ?  ?  ?  ?  ?  ? ?[place] Ouch ? ? ? ? ?

## 2021-07-19 ENCOUNTER — Encounter: Payer: Self-pay | Admitting: Urology

## 2021-07-19 LAB — SURGICAL PATHOLOGY

## 2021-07-20 ENCOUNTER — Other Ambulatory Visit: Payer: Self-pay

## 2021-07-20 ENCOUNTER — Emergency Department
Admission: EM | Admit: 2021-07-20 | Discharge: 2021-07-20 | Disposition: A | Discharge status: Home / Self Care | Payer: BC Managed Care – PPO | Attending: Emergency Medicine | Admitting: Emergency Medicine

## 2021-07-20 ENCOUNTER — Emergency Department: Payer: BC Managed Care – PPO

## 2021-07-20 DIAGNOSIS — Z8551 Personal history of malignant neoplasm of bladder: Secondary | ICD-10-CM | POA: Insufficient documentation

## 2021-07-20 DIAGNOSIS — I700 Atherosclerosis of aorta: Secondary | ICD-10-CM | POA: Diagnosis not present

## 2021-07-20 DIAGNOSIS — R110 Nausea: Secondary | ICD-10-CM | POA: Insufficient documentation

## 2021-07-20 DIAGNOSIS — D72829 Elevated white blood cell count, unspecified: Secondary | ICD-10-CM | POA: Insufficient documentation

## 2021-07-20 DIAGNOSIS — N1330 Unspecified hydronephrosis: Secondary | ICD-10-CM | POA: Diagnosis not present

## 2021-07-20 DIAGNOSIS — R1031 Right lower quadrant pain: Secondary | ICD-10-CM

## 2021-07-20 DIAGNOSIS — K5900 Constipation, unspecified: Secondary | ICD-10-CM | POA: Insufficient documentation

## 2021-07-20 DIAGNOSIS — R109 Unspecified abdominal pain: Secondary | ICD-10-CM | POA: Diagnosis not present

## 2021-07-20 DIAGNOSIS — Z85828 Personal history of other malignant neoplasm of skin: Secondary | ICD-10-CM | POA: Insufficient documentation

## 2021-07-20 DIAGNOSIS — R944 Abnormal results of kidney function studies: Secondary | ICD-10-CM | POA: Insufficient documentation

## 2021-07-20 DIAGNOSIS — R188 Other ascites: Secondary | ICD-10-CM | POA: Diagnosis not present

## 2021-07-20 DIAGNOSIS — Z96652 Presence of left artificial knee joint: Secondary | ICD-10-CM | POA: Diagnosis not present

## 2021-07-20 DIAGNOSIS — K7689 Other specified diseases of liver: Secondary | ICD-10-CM | POA: Diagnosis not present

## 2021-07-20 LAB — COMPREHENSIVE METABOLIC PANEL
ALT: 15 U/L (ref 0–44)
AST: 24 U/L (ref 15–41)
Albumin: 4.2 g/dL (ref 3.5–5.0)
Alkaline Phosphatase: 36 U/L — ABNORMAL LOW (ref 38–126)
Anion gap: 9 (ref 5–15)
BUN: 17 mg/dL (ref 8–23)
CO2: 24 mmol/L (ref 22–32)
Calcium: 9.2 mg/dL (ref 8.9–10.3)
Chloride: 101 mmol/L (ref 98–111)
Creatinine, Ser: 1.33 mg/dL — ABNORMAL HIGH (ref 0.44–1.00)
GFR, Estimated: 44 mL/min — ABNORMAL LOW (ref >60)
Glucose, Bld: 112 mg/dL — ABNORMAL HIGH (ref 70–99)
Potassium: 4.2 mmol/L (ref 3.5–5.1)
Sodium: 134 mmol/L — ABNORMAL LOW (ref 135–145)
Total Bilirubin: 1 mg/dL (ref 0.3–1.2)
Total Protein: 7.1 g/dL (ref 6.5–8.1)

## 2021-07-20 LAB — CBC
HCT: 37.3 % (ref 36.0–46.0)
Hemoglobin: 12.3 g/dL (ref 12.0–15.0)
MCH: 30.4 pg (ref 26.0–34.0)
MCHC: 33 g/dL (ref 30.0–36.0)
MCV: 92.1 fL (ref 80.0–100.0)
Platelets: 304 10*3/uL (ref 150–400)
RBC: 4.05 MIL/uL (ref 3.87–5.11)
RDW: 12.8 % (ref 11.5–15.5)
WBC: 12.3 10*3/uL — ABNORMAL HIGH (ref 4.0–10.5)
nRBC: 0 % (ref 0.0–0.2)

## 2021-07-20 LAB — URINALYSIS, ROUTINE W REFLEX MICROSCOPIC
Bilirubin Urine: NEGATIVE
Glucose, UA: NEGATIVE mg/dL
Ketones, ur: 5 mg/dL — AB
Nitrite: NEGATIVE
Protein, ur: NEGATIVE mg/dL
RBC / HPF: >50 RBC/hpf — ABNORMAL HIGH (ref 0–5)
Specific Gravity, Urine: 1.015 (ref 1.005–1.030)
Squamous Epithelial / HPF: NONE SEEN (ref 0–5)
pH: 6 (ref 5.0–8.0)

## 2021-07-20 LAB — LIPASE, BLOOD: Lipase: 37 U/L (ref 11–51)

## 2021-07-20 MED ORDER — IOHEXOL 300 MG/ML  SOLN
80.0000 mL | Freq: Once | INTRAMUSCULAR | Status: AC | PRN
  Administered 2021-07-20: 80 mL via INTRAVENOUS

## 2021-07-20 MED ORDER — SODIUM CHLORIDE 0.9 % IV BOLUS (SEPSIS)
1000.0000 mL | Freq: Once | INTRAVENOUS | Status: AC
  Administered 2021-07-20: 1000 mL via INTRAVENOUS

## 2021-07-20 MED ORDER — MORPHINE SULFATE (PF) 4 MG/ML IV SOLN
4.0000 mg | Freq: Once | INTRAVENOUS | Status: AC
  Administered 2021-07-20: 4 mg via INTRAVENOUS
  Filled 2021-07-20: qty 1

## 2021-07-20 MED ORDER — HYDROMORPHONE HCL 1 MG/ML IJ SOLN
1.0000 mg | Freq: Once | INTRAMUSCULAR | Status: AC
  Administered 2021-07-20: 1 mg via INTRAVENOUS
  Filled 2021-07-20: qty 1

## 2021-07-20 MED ORDER — SODIUM CHLORIDE 0.9 % IV SOLN
1.0000 g | Freq: Once | INTRAVENOUS | Status: AC
  Administered 2021-07-20: 1 g via INTRAVENOUS
  Filled 2021-07-20: qty 10

## 2021-07-20 MED ORDER — ONDANSETRON HCL 4 MG/2ML IJ SOLN
4.0000 mg | Freq: Once | INTRAMUSCULAR | Status: AC
  Administered 2021-07-20: 4 mg via INTRAVENOUS
  Filled 2021-07-20: qty 2

## 2021-07-20 MED ORDER — SODIUM CHLORIDE 0.9 % IV SOLN
INTRAVENOUS | Status: DC
Start: 2021-07-20 — End: 2021-07-20

## 2021-07-20 NOTE — ED Provider Notes (Signed)
? ?[facility] ?Provider Note ? ? ? Event Date/Time  ? First MD Initiated Contact with Patient 07/20/21 [phone]   ?  (approximate) ? ? ?History  ? ?Abdominal Pain ? ? ?HPI ? ?Jennifer Hammond is a 66 y.o. female with history of endometriosis, bladder cancer status post transurethral resection of bladder tumor in 2019 with 1 year of intravesical mitomycin, status post total hysterectomy who presents to the emergency department complaints of right flank pain and right-sided abdominal pain.  Patient states symptoms started on Tuesday, April 18.  She underwent cystoscopy with bladder biopsy by Dr. Yves Dill with urology on April 20.  Denies any fevers or chills but has had nausea.  No vomiting.  Has been constipated since surgery.  Has a Foley catheter in place which has been draining well.  No gross hematuria. ? ? ?History provided by patient. ? ? ? ?Past Medical History:  ?Diagnosis Date  ? Anxiety   ? Cancer [facility]) 2012  ? MELANOMA  ? Complication of anesthesia   ? Endometriosis   ? GERD (gastroesophageal reflux disease)   ? Headache   ? migraines  ? Heart murmur   ? asymptomatic  ? MVP (mitral valve prolapse)   ? ASYMPTOMATIC-NO MEDS-DOES NOT SEE CARDIOLOGIST-LAST ECHO IN 2016  ? Osteoarthritis   ? hands and neck  ? Osteoporosis   ? PONV (postoperative nausea and vomiting)   ? nausea only  ? ? ?Past Surgical History:  ?Procedure Laterality Date  ? ABDOMINAL HYSTERECTOMY  1991  ? TAH,BSO  ? CYSTOSCOPY W/ RETROGRADES N/A 07/18/2021  ? Procedure: CYSTOSCOPY WITH RETROGRADE PYELOGRAM;  Surgeon: Royston Cowper, MD;  Location: [facility] ORS;  Service: Urology;  Laterality: N/A;  ? CYSTOSCOPY WITH BIOPSY N/A 04/20/2018  ? Procedure: CYSTOSCOPY WITH BLADDER BIOPSY;  Surgeon: Royston Cowper, MD;  Location: [facility] ORS;  Service: Urology;  Laterality: N/A;  ? CYSTOSCOPY WITH BIOPSY N/A 07/18/2021  ? Procedure: CYSTOSCOPY WITH BIOPSY;  Surgeon: Royston Cowper, MD;  Location: [facility] ORS;  Service: Urology;  Laterality: N/A;   ? EXCISION OF MELANOMA  2012  ? FACIAL COSMETIC SURGERY  2011  ? FRACTURE SURGERY    ? knee fracture  ? JOINT REPLACEMENT Left   ? OOPHORECTOMY    ? BSO  ? PELVIC LAPAROSCOPY  1990  ? DIAGNOSTIC LAP  ? TOTAL HIP ARTHROPLASTY    ? TRANSURETHRAL RESECTION OF BLADDER TUMOR WITH MITOMYCIN-C N/A 10/27/2017  ? Procedure: TRANSURETHRAL RESECTION OF BLADDER TUMOR WITH MITOMYCIN-C;  Surgeon: Royston Cowper, MD;  Location: [facility] ORS;  Service: Urology;  Laterality: N/A;  ? ? ?MEDICATIONS:  ?Prior to Admission medications   ?Medication Sig Start Date End Date Taking? Authorizing Provider  ?albuterol (VENTOLIN HFA) 108 (90 Base) MCG/ACT inhaler Inhale 2 puffs into the lungs every 6 (six) hours as needed for wheezing or shortness of breath (or cough as needed.). 05/28/20   Talmage Nap, PA-C  ?ALPRAZolam (XANAX) 0.25 MG tablet Take 0.125 mg by mouth 2 (two) times daily as needed for anxiety.     [provider]  ?B Complex-C (B-COMPLEX WITH VITAMIN C) tablet Take 1 tablet by mouth daily at 12 noon.    [provider]  ?cetirizine (ZYRTEC) 10 MG tablet Take 10 mg by mouth daily as needed for allergies.    [provider]  ?Cholecalciferol (VITAMIN D3) 2000 units TABS Take 2,000 Units by mouth daily at 12 noon.    [provider]  ?estradiol (  ESTRACE) 0.1 MG/GM vaginal cream Place 1 Applicatorful vaginally at bedtime. 03/12/21   [provider]  ?estradiol (ESTRING) 2 MG vaginal ring Place 2 mg vaginally every 3 (three) months. follow package directions 05/03/21   Tamela Gammon, NP  ?FLUoxetine (PROZAC) 20 MG capsule Take 20 mg by mouth at bedtime.     [provider]  ?fluticasone (FLONASE) 50 MCG/ACT nasal spray Place 2 sprays into both nostrils daily as needed for allergies or rhinitis.    [provider]  ?ibuprofen (ADVIL,MOTRIN) 200 MG tablet Take 400 mg by mouth every 8 (eight) hours as needed (for pain.).    [provider]  ?Magnesium 250  MG TABS Take 250 mg by mouth daily.    [provider]  ?Meth-Hyo-M Bl-Na Phos-Ph Sal (URIBEL) 118 MG CAPS Take 1 capsule (118 mg total) by mouth every 6 (six) hours as needed (dysuria). 07/18/21   Royston Cowper, MD  ?Multiple Vitamin (MULTIVITAMIN WITH MINERALS) TABS tablet Take 1 tablet by mouth daily at 12 noon. Centrum Silver Multivitamin    [provider]  ?pantoprazole (PROTONIX) 40 MG tablet Take 40 mg by mouth daily as needed (acid reflux).    [provider]  ?Polyethyl Glycol-Propyl Glycol (SYSTANE) 0.4-0.3 % GEL ophthalmic gel Place 1 application. into both eyes at bedtime.    [provider]  ?Probiotic Product (DIGESTIVE ADV MULTI-STRAIN PO) Take 1 tablet by mouth daily.    [provider]  ?Propylene Glycol (SYSTANE BALANCE) 0.6 % SOLN Place 1 drop into both eyes daily as needed (dry eyes).    [provider]  ?rizatriptan (MAXALT) 10 MG tablet Take 5 mg by mouth 2 (two) times daily as needed for migraine. May repeat in 2 hours if needed    [provider]  ?sulfamethoxazole-trimethoprim (BACTRIM DS) 800-160 MG tablet Take 1 tablet by mouth 2 (two) times daily. 07/18/21   Royston Cowper, MD  ?vitamin C (ASCORBIC ACID) 500 MG tablet Take 500 mg by mouth 3 (three) times a week.    [provider]  ?zinc gluconate 50 MG tablet Take 50 mg by mouth daily.    [provider]  ? ? ?Physical Exam  ? ?Triage Vital Signs: ?ED Triage Vitals  ?Enc Vitals Group  ?   BP 07/20/21 0542 135/69  ?   Pulse Rate 07/20/21 0542 83  ?   Resp 07/20/21 0542 18  ?   Temp 07/20/21 0542 98.5 ?F (36.9 ?C)  ?   Temp src --   ?   SpO2 07/20/21 0542 93 %  ?   Weight 07/20/21 0540 119 lb 0.8 oz (54 kg)  ?   Height 07/20/21 0540 '5\' 4"'$  (1.626 m)  ?   Head Circumference --   ?   Peak Flow --   ?   Pain Score 07/20/21 0540 9  ?   Pain Loc --   ?   Pain Edu? --   ?   Excl. in [place]? --   ? ? ?Most recent vital signs: ?Vitals:  ? 07/20/21 0542  ?BP: 135/69   ?Pulse: 83  ?Resp: 18  ?Temp: 98.5 ?F (36.9 ?C)  ?SpO2: 93%  ? ? ?CONSTITUTIONAL: Alert and oriented and responds appropriately to questions. Well-appearing; well-nourished ?HEAD: Normocephalic, atraumatic ?EYES: Conjunctivae clear, pupils appear equal, sclera nonicteric ?ENT: normal nose; moist mucous membranes ?NECK: Supple, normal ROM ?CARD: RRR; S1 and S2 appreciated; no murmurs, no clicks, no rubs, no gallops ?RESP:  Normal chest excursion without splinting or tachypnea; breath sounds clear and equal bilaterally; no wheezes, no rhonchi, no rales, no hypoxia or respiratory distress, speaking full sentences ?ABD/GI: Normal bowel sounds; non-distended; soft, tender to palpation in the right lower quadrant and suprapubic area without guarding or rebound ?BACK: The back appears normal, mild right CVA tenderness ?EXT: Normal ROM in all joints; no deformity noted, no edema; no cyanosis ?SKIN: Normal color for age and race; warm; no rash on exposed skin ?NEURO: Moves all extremities equally, normal speech ?PSYCH: The patient's mood and manner are appropriate. ? ? ?ED Results / Procedures / Treatments  ? ?LABS: ?(all labs ordered are listed, but only abnormal results are displayed) ?Labs Reviewed  ?COMPREHENSIVE METABOLIC PANEL - Abnormal; Notable for the following components:  ?    Result Value  ? Sodium 134 (*)   ? Glucose, Bld 112 (*)   ? Creatinine, Ser 1.33 (*)   ? Alkaline Phosphatase 36 (*)   ? GFR, Estimated 44 (*)   ? All other components within normal limits  ?CBC - Abnormal; Notable for the following components:  ? WBC 12.3 (*)   ? All other components within normal limits  ?URINE CULTURE  ?URINALYSIS, ROUTINE W REFLEX MICROSCOPIC  ?LIPASE, BLOOD  ? ? ? ?EKG: ? ? ?RADIOLOGY: ?My personal review and interpretation of imaging: CT scan concerning for right a sending UTI without pyelonephritis or obstruction. ? ?I have personally reviewed all radiology reports.   ?CT ABDOMEN PELVIS W CONTRAST ? ?Result Date:  07/20/2021 ?CLINICAL DATA:  66 year old female with history of right-sided lower abdominal pain since 10 p.m. yesterday evening. Nausea. History of bladder biopsy earlier this week. EXAM: CT ABDOMEN AND PELVIS

## 2021-07-20 NOTE — ED Provider Notes (Signed)
Patient signed out to me pending urine.  She is a 66 year old female who had a recent bladder biopsy and cystoscopy performed with Dr. Eliberto Ivory just 2 days ago.  Presenting with right flank pain.  Apparently was having some right flank pain prior to the procedure but worsened last night.  CT showing some enhancement of the collecting system which could be consistent with Pilo and hydroureter but no obstruction.  UA with 11-20 WBCs greater than 50 RBCs.  She has a mild leukocytosis to 12.3. ? ?Discussed with Dr. Eliberto Ivory and he thinks that the symptoms are most likely related to instrumentation and some irritation since the orifice is cannulated during the procedure.  She has been on 3 days of Bactrim he does not feel that she requires any additional antibiotics think this is normal after procedure.  Discussed this with the patient.  She will follow-up as needed. ?  ?Rada Hay, MD ?07/20/21 [phone] ? ?

## 2021-07-20 NOTE — ED Triage Notes (Addendum)
Pt with right sided lower abd pain that began at 2200 last pm. Pt states has had nausea. Pt recently had a bladder biopsy this week pt has a foley in place.  ?

## 2021-07-20 NOTE — ED Notes (Signed)
Patient to CT via stretcher.

## 2021-07-21 LAB — URINE CULTURE: Culture: NO GROWTH

## 2021-07-24 DIAGNOSIS — R310 Gross hematuria: Secondary | ICD-10-CM | POA: Diagnosis not present

## 2021-07-24 DIAGNOSIS — E559 Vitamin D deficiency, unspecified: Secondary | ICD-10-CM | POA: Diagnosis not present

## 2021-07-24 DIAGNOSIS — Z0000 Encounter for general adult medical examination without abnormal findings: Secondary | ICD-10-CM | POA: Diagnosis not present

## 2021-07-24 DIAGNOSIS — Z13220 Encounter for screening for lipoid disorders: Secondary | ICD-10-CM | POA: Diagnosis not present

## 2021-07-24 DIAGNOSIS — Z1389 Encounter for screening for other disorder: Secondary | ICD-10-CM | POA: Diagnosis not present

## 2021-07-24 DIAGNOSIS — R8289 Other abnormal findings on cytological and histological examination of urine: Secondary | ICD-10-CM | POA: Diagnosis not present

## 2021-07-24 DIAGNOSIS — Z8551 Personal history of malignant neoplasm of bladder: Secondary | ICD-10-CM | POA: Diagnosis not present

## 2021-07-24 DIAGNOSIS — Z131 Encounter for screening for diabetes mellitus: Secondary | ICD-10-CM | POA: Diagnosis not present

## 2021-07-31 DIAGNOSIS — C689 Malignant neoplasm of urinary organ, unspecified: Secondary | ICD-10-CM | POA: Diagnosis not present

## 2021-07-31 DIAGNOSIS — Z0000 Encounter for general adult medical examination without abnormal findings: Secondary | ICD-10-CM | POA: Diagnosis not present

## 2021-08-05 DIAGNOSIS — R3121 Asymptomatic microscopic hematuria: Secondary | ICD-10-CM | POA: Diagnosis not present

## 2021-08-05 DIAGNOSIS — Z8551 Personal history of malignant neoplasm of bladder: Secondary | ICD-10-CM | POA: Diagnosis not present

## 2021-08-05 DIAGNOSIS — N390 Urinary tract infection, site not specified: Secondary | ICD-10-CM | POA: Diagnosis not present

## 2021-08-06 ENCOUNTER — Other Ambulatory Visit: Payer: Self-pay | Admitting: Internal Medicine

## 2021-08-06 DIAGNOSIS — C689 Malignant neoplasm of urinary organ, unspecified: Secondary | ICD-10-CM

## 2021-08-16 DIAGNOSIS — Z8550 Personal history of malignant neoplasm of unspecified urinary tract organ: Secondary | ICD-10-CM | POA: Diagnosis not present

## 2021-08-16 DIAGNOSIS — R310 Gross hematuria: Secondary | ICD-10-CM | POA: Diagnosis not present

## 2021-08-16 DIAGNOSIS — N390 Urinary tract infection, site not specified: Secondary | ICD-10-CM | POA: Diagnosis not present

## 2021-09-16 DIAGNOSIS — R310 Gross hematuria: Secondary | ICD-10-CM | POA: Diagnosis not present

## 2021-09-16 DIAGNOSIS — R82998 Other abnormal findings in urine: Secondary | ICD-10-CM | POA: Diagnosis not present

## 2021-09-16 DIAGNOSIS — N3289 Other specified disorders of bladder: Secondary | ICD-10-CM | POA: Diagnosis not present

## 2021-09-16 DIAGNOSIS — Z8551 Personal history of malignant neoplasm of bladder: Secondary | ICD-10-CM | POA: Diagnosis not present

## 2021-09-16 DIAGNOSIS — M5489 Other dorsalgia: Secondary | ICD-10-CM | POA: Diagnosis not present

## 2021-09-19 ENCOUNTER — Other Ambulatory Visit: Payer: Self-pay | Admitting: Urology

## 2021-09-19 ENCOUNTER — Other Ambulatory Visit (HOSPITAL_COMMUNITY): Payer: Self-pay | Admitting: Urology

## 2021-09-19 DIAGNOSIS — Z8551 Personal history of malignant neoplasm of bladder: Secondary | ICD-10-CM

## 2021-09-19 DIAGNOSIS — R310 Gross hematuria: Secondary | ICD-10-CM

## 2021-09-25 ENCOUNTER — Ambulatory Visit
Admission: RE | Admit: 2021-09-25 | Discharge: 2021-09-25 | Disposition: A | Discharge status: Home / Self Care | Payer: BC Managed Care – PPO | Source: Ambulatory Visit | Attending: Urology | Admitting: Urology

## 2021-09-25 DIAGNOSIS — Z8551 Personal history of malignant neoplasm of bladder: Secondary | ICD-10-CM

## 2021-09-25 DIAGNOSIS — C679 Malignant neoplasm of bladder, unspecified: Secondary | ICD-10-CM | POA: Diagnosis not present

## 2021-09-25 DIAGNOSIS — R310 Gross hematuria: Secondary | ICD-10-CM | POA: Diagnosis not present

## 2021-09-25 DIAGNOSIS — N2889 Other specified disorders of kidney and ureter: Secondary | ICD-10-CM | POA: Diagnosis not present

## 2021-09-25 DIAGNOSIS — R769 Abnormal immunological finding in serum, unspecified: Secondary | ICD-10-CM | POA: Diagnosis not present

## 2021-09-25 LAB — POCT I-STAT CREATININE: Creatinine, Ser: 0.7 mg/dL (ref 0.44–1.00)

## 2021-09-25 MED ORDER — IOHEXOL 300 MG/ML  SOLN
125.0000 mL | Freq: Once | INTRAMUSCULAR | Status: AC | PRN
  Administered 2021-09-25: 125 mL via INTRAVENOUS

## 2021-10-03 DIAGNOSIS — R310 Gross hematuria: Secondary | ICD-10-CM | POA: Diagnosis not present

## 2021-10-03 DIAGNOSIS — Z8551 Personal history of malignant neoplasm of bladder: Secondary | ICD-10-CM | POA: Diagnosis not present

## 2021-10-04 DIAGNOSIS — C689 Malignant neoplasm of urinary organ, unspecified: Secondary | ICD-10-CM | POA: Diagnosis not present

## 2021-10-15 ENCOUNTER — Other Ambulatory Visit: Payer: BC Managed Care – PPO

## 2021-10-16 DIAGNOSIS — N23 Unspecified renal colic: Secondary | ICD-10-CM | POA: Diagnosis not present

## 2021-10-16 DIAGNOSIS — Z8551 Personal history of malignant neoplasm of bladder: Secondary | ICD-10-CM | POA: Diagnosis not present

## 2021-10-16 DIAGNOSIS — N310 Uninhibited neuropathic bladder, not elsewhere classified: Secondary | ICD-10-CM | POA: Diagnosis not present

## 2021-10-30 DIAGNOSIS — Z1231 Encounter for screening mammogram for malignant neoplasm of breast: Secondary | ICD-10-CM | POA: Diagnosis not present

## 2021-10-31 ENCOUNTER — Encounter: Payer: Self-pay | Admitting: Nurse Practitioner

## 2021-10-31 ENCOUNTER — Other Ambulatory Visit: Payer: BC Managed Care – PPO

## 2021-11-01 ENCOUNTER — Encounter
Admission: RE | Admit: 2021-11-01 | Discharge: 2021-11-01 | Disposition: A | Discharge status: Home / Self Care | Payer: BC Managed Care – PPO | Source: Ambulatory Visit | Attending: Urology | Admitting: Urology

## 2021-11-01 ENCOUNTER — Other Ambulatory Visit: Payer: Self-pay

## 2021-11-01 NOTE — H&P (Signed)
NAMEBETSAIDA, [place] MEDICAL RECORD NO: [id number] ACCOUNT NO: [id number] DATE OF BIRTH: Nov 04, 1955 PHYSICIAN: Otelia Limes. Yves Dill, MD  History and Physical   DATE OF ADMISSION: 11/07/2021  CHIEF COMPLAINT:  Recurrent hematuria and right lower quadrant abdominal pain radiating to the right flank.  HISTORY OF PRESENT ILLNESS:  The patient is a 66 year old white female, who has had recurrent gross hematuria for the past month.  She has also noted right lower quadrant discomfort that radiates to her right flank.  CT scan on June 28th did not find any  sources for the hematuria and there was no hydronephrosis.  She did have a urine cytology on June 19th, which revealed dysplasia and calcium oxalate crystals.  She underwent cystoscopy with random bladder biopsies and bilateral retrogrades in April,  revealing no filling defects in the collecting system and bladder biopsy showed atypia.  She comes in now for cystoscopy with right ureteroscopy with possible holmium lithotripsy of kidney stone if present and/or possible biopsy if urothelial lesion is present.  ____ No drug allergies.  CURRENT MEDICATIONS:  Prozac, vitamin D, vitamin B, multivitamin, zinc, magnesium, vitamin C.  PAST SURGICAL HISTORY:  1.  Hysterectomy in 1991 due to endometriosis. 2.  Eyelid surgery, 2011. 3.  Left hip replacement, 2016. 4.  Repair of fractured left patella, 2018. 5.  Transurethral resection of bladder tumor, 2019. 6.  Cystoscopy with bladder biopsy, 2020. 7.  Cystoscopy with bilateral retrograde and bladder biopsy, April 2023.  PAST AND CURRENT MEDICAL CONDITIONS:  1.  GERD. 2.  Migraine headaches. 3.  Osteoporosis. 4.  Hyperlipidemia. 5.  History of basal cell carcinoma and melanoma of the chest wall, status post removals in 2012 and 2019. 6.  History of high-grade superficial bladder cancer, status post transurethral resection of bladder tumor, followed by intravesical mitomycin for 1 year in 2019.  REVIEW  OF SYSTEMS:  The patient reports allergic rhinitis and decreased visual acuity.  She denies chest pain, shortness of breath, diabetes, stroke or heart disease.  FAMILY HISTORY:  Father died at age 105 of a stroke, but also had bladder cancer prior to that.  Mother is living, age 56 without history of cancer.  Mother does have kidney stones.  PHYSICAL EXAMINATION:   VITAL SIGNS:  Height was 5 feet 3 inches, weight 122 pounds, BMI 22. GENERAL:  Well-nourished white female in no acute distress. HEENT:  Sclerae were clear. NECK:  No palpable masses or tenderness. LYMPHATIC:  No palpable cervical or inguinal adenopathy. PULMONARY:  Clear to auscultation. CARDIOVASCULAR:  Regular rhythm and rate. ABDOMEN:  Soft, nontender abdomen.  No CVA tenderness. GU AND RECTAL:  Deferred. NEUROMUSCULAR:  Alert and oriented x3.  IMPRESSION:  1.  Gross hematuria. 2.  Right lower quadrant pain radiating to the right flank. 3.  Calcium oxalate crystals on urinalysis. 4.  History of high-grade superficial bladder cancer. 5.  Urine cytologies with dysplasia.  PLAN:  Cystoscopy with right ureteroscopy, possible right ureteroscopic ureterolithotomy with holmium laser and possible biopsy if ureteral lesion present.   CHR D: 10/31/2021 1:38:46 pm T: 10/31/2021 1:49:00 pm  JOB: [id number]

## 2021-11-01 NOTE — Patient Instructions (Addendum)
Your procedure is scheduled on: 11/07/21 Report to [place]. To find out your arrival time please call [phone] between 1PM - 3PM on 11/06/21.  Remember: Instructions that are not followed completely may result in serious medical risk, up to and including death, or upon the discretion of your surgeon and anesthesiologist your surgery may need to be rescheduled.     _X__ 1. Do not eat food after midnight the night before your procedure.                 No gum chewing or hard candies. You may drink clear liquids up to 2 hours                 before you are scheduled to arrive for your surgery- DO not drink clear                 liquids within 2 hours of the start of your surgery.                 Clear Liquids include:  water, apple juice without pulp, clear carbohydrate                 drink such as Clearfast or Gatorade, Black Coffee or Tea (Do not add                 anything to coffee or tea). Diabetics water only  __X__2.  On the morning of surgery brush your teeth with toothpaste and water, you                 may rinse your mouth with mouthwash if you wish.  Do not swallow any              toothpaste of mouthwash.     _X__ 3.  No Alcohol for 24 hours before or after surgery.   _X__ 4.  Do Not Smoke or use e-cigarettes For 24 Hours Prior to Your Surgery.                 Do not use any chewable tobacco products for at least 6 hours prior to                 surgery.  ____  5.  Bring all medications with you on the day of surgery if instructed.   __X__  6.  Notify your doctor if there is any change in your medical condition      (cold, fever, infections).     Do not wear jewelry, make-up, hairpins, clips or nail polish. Do not wear lotions, powders, or perfumes.  Do not shave 48 hours prior to surgery. Men may shave face and neck. Do not bring valuables to the hospital.    [facility] is not responsible for any belongings or  valuables.  Contacts, dentures/partials or body piercings may not be worn into surgery. Bring a case for your contacts, glasses or hearing aids, a denture cup will be supplied. Leave your suitcase in the car. After surgery it may be brought to your room. For patients admitted to the hospital, discharge time is determined by your treatment team.   Patients discharged the day of surgery will not be allowed to drive home.    __X__ Take these medicines the morning of surgery with A SIP OF WATER:    1. pantoprazole (PROTONIX) 40 MG tablet  2. ALPRAZolam (XANAX) 0.25 MG tablet if  needed  3.   4.  5.  6.  ____ Fleet Enema (as directed)   ____ Use CHG Soap/SAGE wipes as directed  ____ Use inhalers on the day of surgery  ____ Stop metformin/Janumet/Farxiga 2 days prior to surgery    ____ Take 1/2 of usual insulin dose the night before surgery. No insulin the morning          of surgery.   ____ Stop Blood Thinners Coumadin/Plavix/Xarelto/Pleta/Pradaxa/Eliquis/Effient/Aspirin  on   Or contact your Surgeon, Cardiologist or Medical Doctor regarding  ability to stop your blood thinners  __X__ Stop Anti-inflammatories 7 days before surgery such as Advil, Ibuprofen, Motrin,  BC or Goodies Powder, Naprosyn, Naproxen, Aleve, Aspirin   May use Tylenol if needed  __X__ Stop all herbals and supplements, fish oil or vitamins today, until after surgery.    ____ Bring C-Pap to the hospital.

## 2021-11-04 DIAGNOSIS — R932 Abnormal findings on diagnostic imaging of liver and biliary tract: Secondary | ICD-10-CM | POA: Diagnosis not present

## 2021-11-05 ENCOUNTER — Other Ambulatory Visit: Payer: Self-pay | Admitting: Gastroenterology

## 2021-11-05 DIAGNOSIS — R932 Abnormal findings on diagnostic imaging of liver and biliary tract: Secondary | ICD-10-CM

## 2021-11-07 ENCOUNTER — Encounter
Admission: RE | Disposition: A | Discharge status: Home / Self Care | Payer: Self-pay | Source: Ambulatory Visit | Attending: Urology

## 2021-11-07 ENCOUNTER — Ambulatory Visit: Payer: BC Managed Care – PPO

## 2021-11-07 ENCOUNTER — Ambulatory Visit: Payer: BC Managed Care – PPO | Admitting: Anesthesiology

## 2021-11-07 ENCOUNTER — Ambulatory Visit
Admission: RE | Admit: 2021-11-07 | Discharge: 2021-11-07 | Disposition: A | Discharge status: Home / Self Care | Payer: BC Managed Care – PPO | Source: Ambulatory Visit | Attending: Urology | Admitting: Urology

## 2021-11-07 ENCOUNTER — Encounter: Payer: Self-pay | Admitting: Urology

## 2021-11-07 ENCOUNTER — Other Ambulatory Visit: Payer: Self-pay

## 2021-11-07 DIAGNOSIS — T8859XA Other complications of anesthesia, initial encounter: Secondary | ICD-10-CM | POA: Diagnosis not present

## 2021-11-07 DIAGNOSIS — N135 Crossing vessel and stricture of ureter without hydronephrosis: Secondary | ICD-10-CM | POA: Diagnosis not present

## 2021-11-07 DIAGNOSIS — N131 Hydronephrosis with ureteral stricture, not elsewhere classified: Secondary | ICD-10-CM | POA: Diagnosis not present

## 2021-11-07 DIAGNOSIS — K219 Gastro-esophageal reflux disease without esophagitis: Secondary | ICD-10-CM | POA: Diagnosis not present

## 2021-11-07 DIAGNOSIS — R310 Gross hematuria: Secondary | ICD-10-CM | POA: Diagnosis not present

## 2021-11-07 DIAGNOSIS — Z8551 Personal history of malignant neoplasm of bladder: Secondary | ICD-10-CM | POA: Insufficient documentation

## 2021-11-07 DIAGNOSIS — N3289 Other specified disorders of bladder: Secondary | ICD-10-CM | POA: Diagnosis not present

## 2021-11-07 DIAGNOSIS — N23 Unspecified renal colic: Secondary | ICD-10-CM | POA: Insufficient documentation

## 2021-11-07 DIAGNOSIS — R319 Hematuria, unspecified: Secondary | ICD-10-CM | POA: Diagnosis not present

## 2021-11-07 HISTORY — PX: URETEROSCOPY: SHX842

## 2021-11-07 SURGERY — URETEROSCOPY
Anesthesia: General | Site: Ureter | Laterality: Right

## 2021-11-07 MED ORDER — LIDOCAINE HCL (PF) 2 % IJ SOLN
INTRAMUSCULAR | Status: AC
  Filled 2021-11-07: qty 5

## 2021-11-07 MED ORDER — PROPOFOL 500 MG/50ML IV EMUL
INTRAVENOUS | Status: DC | PRN
  Administered 2021-11-07: 100 ug/kg/min via INTRAVENOUS

## 2021-11-07 MED ORDER — LIDOCAINE HCL URETHRAL/MUCOSAL 2 % EX GEL
CUTANEOUS | Status: AC
  Filled 2021-11-07: qty 10

## 2021-11-07 MED ORDER — NUCYNTA 50 MG PO TABS: 50.0000 mg | ORAL_TABLET | Freq: Four times a day (QID) | ORAL | 0 refills | Status: DC | PRN

## 2021-11-07 MED ORDER — OXYCODONE HCL 5 MG/5ML PO SOLN: 5.0000 mg | Freq: Once | ORAL | Status: DC | PRN

## 2021-11-07 MED ORDER — PROPOFOL 10 MG/ML IV BOLUS
INTRAVENOUS | Status: DC | PRN
  Administered 2021-11-07: 120 mg via INTRAVENOUS

## 2021-11-07 MED ORDER — ORAL CARE MOUTH RINSE: 15.0000 mL | Freq: Once | OROMUCOSAL | Status: AC

## 2021-11-07 MED ORDER — CIPROFLOXACIN HCL 500 MG PO TABS: 500.0000 mg | ORAL_TABLET | Freq: Two times a day (BID) | ORAL | 0 refills | Status: AC

## 2021-11-07 MED ORDER — PROPOFOL 1000 MG/100ML IV EMUL
INTRAVENOUS | Status: AC
  Filled 2021-11-07: qty 100

## 2021-11-07 MED ORDER — ONDANSETRON HCL 4 MG/2ML IJ SOLN
INTRAMUSCULAR | Status: DC | PRN
  Administered 2021-11-07: 4 mg via INTRAVENOUS

## 2021-11-07 MED ORDER — ROCURONIUM BROMIDE 10 MG/ML (PF) SYRINGE
PREFILLED_SYRINGE | INTRAVENOUS | Status: AC
  Filled 2021-11-07: qty 10

## 2021-11-07 MED ORDER — PROPOFOL 10 MG/ML IV BOLUS
INTRAVENOUS | Status: AC
  Filled 2021-11-07: qty 20

## 2021-11-07 MED ORDER — CEFAZOLIN SODIUM-DEXTROSE 1-4 GM/50ML-% IV SOLN
INTRAVENOUS | Status: AC
  Filled 2021-11-07: qty 50

## 2021-11-07 MED ORDER — FENTANYL CITRATE (PF) 100 MCG/2ML IJ SOLN: 25.0000 ug | INTRAMUSCULAR | Status: DC | PRN

## 2021-11-07 MED ORDER — DEXAMETHASONE SODIUM PHOSPHATE 10 MG/ML IJ SOLN
INTRAMUSCULAR | Status: DC | PRN
  Administered 2021-11-07: 6 mg via INTRAVENOUS

## 2021-11-07 MED ORDER — SUGAMMADEX SODIUM 200 MG/2ML IV SOLN
INTRAVENOUS | Status: DC | PRN
  Administered 2021-11-07: 125 mg via INTRAVENOUS

## 2021-11-07 MED ORDER — SODIUM CHLORIDE 0.9 % IR SOLN
Status: DC | PRN
  Administered 2021-11-07: 700 mL

## 2021-11-07 MED ORDER — KETOROLAC TROMETHAMINE 30 MG/ML IJ SOLN
INTRAMUSCULAR | Status: AC
  Filled 2021-11-07: qty 1

## 2021-11-07 MED ORDER — ONDANSETRON HCL 4 MG/2ML IJ SOLN
INTRAMUSCULAR | Status: AC
  Filled 2021-11-07: qty 2

## 2021-11-07 MED ORDER — FENTANYL CITRATE (PF) 100 MCG/2ML IJ SOLN
INTRAMUSCULAR | Status: DC | PRN
  Administered 2021-11-07 (×2): 50 ug via INTRAVENOUS

## 2021-11-07 MED ORDER — CHLORHEXIDINE GLUCONATE 0.12 % MT SOLN: 15.0000 mL | Freq: Once | OROMUCOSAL | Status: AC

## 2021-11-07 MED ORDER — LIDOCAINE HCL URETHRAL/MUCOSAL 2 % EX GEL
CUTANEOUS | Status: DC | PRN
  Administered 2021-11-07: 1 via TOPICAL

## 2021-11-07 MED ORDER — ACETAMINOPHEN 10 MG/ML IV SOLN
INTRAVENOUS | Status: AC
  Filled 2021-11-07: qty 100

## 2021-11-07 MED ORDER — STERILE WATER FOR IRRIGATION IR SOLN
Status: DC | PRN
  Administered 2021-11-07: 3000 mL

## 2021-11-07 MED ORDER — ACETAMINOPHEN 10 MG/ML IV SOLN
INTRAVENOUS | Status: DC | PRN
  Administered 2021-11-07: 810 mg via INTRAVENOUS

## 2021-11-07 MED ORDER — CHLORHEXIDINE GLUCONATE 0.12 % MT SOLN
OROMUCOSAL | Status: AC
  Administered 2021-11-07: 15 mL via OROMUCOSAL
  Filled 2021-11-07: qty 15

## 2021-11-07 MED ORDER — FENTANYL CITRATE (PF) 100 MCG/2ML IJ SOLN
INTRAMUSCULAR | Status: AC
  Filled 2021-11-07: qty 2

## 2021-11-07 MED ORDER — LACTATED RINGERS IV SOLN: INTRAVENOUS | Status: DC

## 2021-11-07 MED ORDER — LIDOCAINE HCL (CARDIAC) PF 100 MG/5ML IV SOSY
PREFILLED_SYRINGE | INTRAVENOUS | Status: DC | PRN
  Administered 2021-11-07: 60 mg via INTRAVENOUS

## 2021-11-07 MED ORDER — KETOROLAC TROMETHAMINE 30 MG/ML IJ SOLN
INTRAMUSCULAR | Status: DC | PRN
  Administered 2021-11-07: 15 mg via INTRAVENOUS

## 2021-11-07 MED ORDER — MIDAZOLAM HCL 2 MG/2ML IJ SOLN
INTRAMUSCULAR | Status: DC | PRN
  Administered 2021-11-07: 2 mg via INTRAVENOUS

## 2021-11-07 MED ORDER — MIDAZOLAM HCL 2 MG/2ML IJ SOLN
INTRAMUSCULAR | Status: AC
  Filled 2021-11-07: qty 2

## 2021-11-07 MED ORDER — OXYCODONE HCL 5 MG PO TABS: 5.0000 mg | ORAL_TABLET | Freq: Once | ORAL | Status: DC | PRN

## 2021-11-07 MED ORDER — IOHEXOL 180 MG/ML  SOLN
INTRAMUSCULAR | Status: DC | PRN
  Administered 2021-11-07: 10 mL

## 2021-11-07 MED ORDER — ROCURONIUM BROMIDE 100 MG/10ML IV SOLN
INTRAVENOUS | Status: DC | PRN
  Administered 2021-11-07: 40 mg via INTRAVENOUS
  Administered 2021-11-07: 20 mg via INTRAVENOUS

## 2021-11-07 MED ORDER — DOCUSATE SODIUM 100 MG PO CAPS: 200.0000 mg | ORAL_CAPSULE | Freq: Two times a day (BID) | ORAL | 3 refills | Status: DC

## 2021-11-07 MED ORDER — CEFAZOLIN SODIUM-DEXTROSE 1-4 GM/50ML-% IV SOLN
1.0000 g | Freq: Once | INTRAVENOUS | Status: AC
  Administered 2021-11-07: 1 g via INTRAVENOUS

## 2021-11-07 MED ORDER — URIBEL 118 MG PO CAPS: 1.0000 | ORAL_CAPSULE | Freq: Four times a day (QID) | ORAL | 3 refills | Status: AC | PRN

## 2021-11-07 MED ORDER — DEXAMETHASONE SODIUM PHOSPHATE 10 MG/ML IJ SOLN
INTRAMUSCULAR | Status: AC
  Filled 2021-11-07: qty 1

## 2021-11-07 SURGICAL SUPPLY — 33 items
BAG DRAIN SIEMENS DORNER NS (MISCELLANEOUS) ×4 IMPLANT
BAG DRN NS LF (MISCELLANEOUS) ×2
BRUSH SCRUB EZ 1% IODOPHOR (MISCELLANEOUS) ×4 IMPLANT
CATH URETL OPEN 5X70 (CATHETERS) ×4 IMPLANT
CNTNR SPEC 2.5X3XGRAD LEK (MISCELLANEOUS)
CONT SPEC 4OZ STER OR WHT (MISCELLANEOUS)
CONT SPEC 4OZ STRL OR WHT (MISCELLANEOUS)
CONTAINER SPEC 2.5X3XGRAD LEK (MISCELLANEOUS) IMPLANT
DRSG TELFA 3X8 NADH (GAUZE/BANDAGES/DRESSINGS) ×3 IMPLANT
FIBER LASER MOSES 365 DFL (Laser) IMPLANT
GAUZE 4X4 16PLY ~~LOC~~+RFID DBL (SPONGE) ×8 IMPLANT
GLOVE BIO SURGEON STRL SZ7.5 (GLOVE) ×4 IMPLANT
GOWN STRL REUS W/ TWL LRG LVL3 (GOWN DISPOSABLE) ×3 IMPLANT
GOWN STRL REUS W/ TWL XL LVL3 (GOWN DISPOSABLE) ×3 IMPLANT
GOWN STRL REUS W/TWL LRG LVL3 (GOWN DISPOSABLE) ×3
GOWN STRL REUS W/TWL XL LVL3 (GOWN DISPOSABLE) ×3
GUIDEWIRE STR DUAL SENSOR (WIRE) ×4 IMPLANT
GUIDEWIRE STR ZIPWIRE 035X150 (MISCELLANEOUS) ×2 IMPLANT
IV NS IRRIG 3000ML ARTHROMATIC (IV SOLUTION) ×4 IMPLANT
KIT BALLN UROMAX 15FX4 (MISCELLANEOUS) ×1 IMPLANT
KIT BALLN UROMAX 26 75X4 (MISCELLANEOUS) ×3
KIT TURNOVER CYSTO (KITS) ×4 IMPLANT
PACK CYSTO AR (MISCELLANEOUS) ×4 IMPLANT
PAD DRESSING TELFA 3X8 NADH (GAUZE/BANDAGES/DRESSINGS) IMPLANT
SET CYSTO W/LG BORE CLAMP LF (SET/KITS/TRAYS/PACK) ×4 IMPLANT
STENT URET 6FRX24 CONTOUR (STENTS) ×2 IMPLANT
SURGILUBE 2OZ TUBE FLIPTOP (MISCELLANEOUS) ×4 IMPLANT
SYR 10ML LL (SYRINGE) ×4 IMPLANT
TRACTIP FLEXIVA PULSE ID 200 (Laser) ×2 IMPLANT
TRAP FLUID SMOKE EVACUATOR (MISCELLANEOUS) ×2 IMPLANT
VALVE UROSEAL ADJ ENDO (VALVE) ×2 IMPLANT
WATER STERILE IRR 3000ML UROMA (IV SOLUTION) ×2 IMPLANT
WATER STERILE IRR 500ML POUR (IV SOLUTION) ×4 IMPLANT

## 2021-11-07 NOTE — Discharge Instructions (Addendum)
Ureteral Stent Implantation, Care After The following information offers guidance on how to care for yourself after your procedure. Your health care provider may also give you more specific instructions. If you have problems or questions, contact your health care provider. What can I expect after the procedure? After the procedure, it is common to have: Nausea. Mild pain when you urinate. You may feel this pain in your lower back or lower abdomen. The pain should stop within a few minutes after you urinate. This pattern may last for up to 1 week. A small amount of blood in your urine for several days. Follow these instructions at home: Medicines Take over-the-counter and prescription medicines only as told by your health care provider. If you were prescribed antibiotics, take them as told by your health care provider. Do not stop using the antibiotic even if you start to feel better. If you were given a sedative during the procedure, it can affect you for several hours. Do not drive or operate machinery until your health care provider says that it is safe. Ask your health care provider if the medicine prescribed to you: Requires you to avoid driving or using machinery. Can cause constipation. You may need to take these actions to prevent or treat constipation: Take over-the-counter or prescription medicines. Eat foods that are high in fiber, such as beans, whole grains, and fresh fruits and vegetables. Limit foods that are high in fat and processed sugars, such as fried or sweet foods. Activity Rest as told by your health care provider. Do not sit for a long time without moving. Get up to take short walks every 1-2 hours. This will improve blood flow and breathing. Ask for help if you feel weak or unsteady. Return to your normal activities as told by your health care provider. Ask your health care provider what activities are safe for you. General instructions A comparison of three sample cups  showing dark yellow, yellow, and pale yellow urine.   If you have a catheter: Follow instructions from your health care provider about taking care of your catheter and collection bag. Do not take baths, swim, or use a hot tub until your health care provider approves. Ask your health care provider if you may take showers. You may only be allowed to take sponge baths. Drink enough fluid to keep your urine pale yellow. Do not use any products that contain nicotine or tobacco. These products include cigarettes, chewing tobacco, and vaping devices, such as e-cigarettes. These can delay healing after surgery. If you need help quitting, ask your health care provider. Keep all follow-up visits. Contact a health care provider if: You start passing blood clots, or you have more than a small amount of blood in your urine. You have pain that gets worse or does not get better with medicine, especially pain when you urinate. You have trouble urinating. You feel nauseous or you vomit again and again during a period of more than 2 days after the procedure. You have a fever. Get help right away if: You are passing blood clots that are 1 inch (2.5 cm) or larger in size. You are leaking urine (have incontinence), or you cannot urinate. The end of the stent comes out of your urethra. You have sudden, sharp, or severe pain in your abdomen or lower back. You have swelling or pain in your legs. You have trouble breathing. These symptoms may be an emergency. Get help right away. Call 911. Do not wait to see if  the symptoms will go away. Do not drive yourself to the hospital. Summary After the procedure, it is common to have mild pain when you urinate that goes away within a few minutes after you urinate. This may last for up to 1 week. Take over-the-counter and prescription medicines only as told by your health care provider. Drink enough fluid to keep your urine pale yellow. Call your health care provider if  you start passing blood clots, or you have more than a small amount of blood in your urine. This information is not intended to replace advice given to you by your health care provider. Make sure you discuss any questions you have with your health care provider. Document Revised: 04/22/2021 Document Reviewed: 04/22/2021 Elsevier Patient Education  [place].     Bladder Biopsy, Care After The following information offers guidance on how to care for yourself after your procedure. Your health care provider may also give you more specific instructions. If you have problems or questions, contact your health care provider. What can I expect after the procedure? After the procedure, it is common to have: Mild pain and burning in your bladder or kidney area when you urinate. Small amounts of blood in your urine. A sudden urge to urinate. A need to urinate more often than usual. Follow these instructions at home: Medicines Take over-the-counter and prescription medicines only as told by your health care provider. If you were prescribed an antibiotic medicine, take it as told by your health care provider. Do not stop using the antibiotic even if you start to feel better. Activity Rest as told by your health care provider. If you were given a sedative during the procedure, it can affect you for several hours. Do not drive or operate machinery until your health care provider says that it is safe. Return to your normal activities as told by your health care provider. Ask your health care provider what activities are safe for you. General instructions A bathtub filled with water.   Take a warm bath to relieve any burning feeling around your urethra. Hold a warm, damp washcloth over the urethral area to ease pain. It is up to you to get the results of your procedure. Ask your health care provider, or the department that is doing the procedure, when your results will be ready. Keep all  follow-up visits. This is important. Contact a health care provider if: Your symptoms do not improve within 24 hours, and you keep having: Burning when you urinate. More blood in your urine. Pain when you urinate. An urgent need to urinate. A need to urinate more often than usual. Get help right away if: You have a fever. You have a lot of blood in your urine. You have bright red blood in your urine. You are passing blood clots in your urine. You have severe pain. You cannot urinate. Summary After the procedure, it is common to have mild pain in your bladder or kidney area, mild burning with urination, and some blood. Take over-the-counter and prescription medicines only as told by your health care provider. If you were prescribed an antibiotic medicine, do not stop using the antibiotic even if you start to feel better. Rest after the procedure. Follow instructions from your health care provider for home care. Get help right away if you have a lot of blood, bright red blood, or blood clots in your urine, severe pain, or fever. This information is not intended to replace advice given to you  by your health care provider. Make sure you discuss any questions you have with your health care provider. Document Revised: 02/24/2021 Document Reviewed: 02/24/2021 Elsevier Patient Education  [place]   The drugs that you were given will stay in your system until tomorrow so for the next 24 hours you should not:  Drive an automobile Make any legal decisions Drink any alcoholic beverage   You may resume regular meals tomorrow.  Today it is better to start with liquids and gradually work up to solid foods.  You may eat anything you prefer, but it is better to start with liquids, then soup and crackers, and gradually work up to solid foods.   Please notify your doctor immediately if you have any unusual bleeding, trouble breathing,  redness and pain at the surgery site, drainage, fever, or pain not relieved by medication.     Additional Instructions: DO NOT PULL OUT STRING!

## 2021-11-07 NOTE — H&P (Signed)
Date of Initial H&P: 10/31/21  History reviewed, patient examined, no change in status, stable for surgery.

## 2021-11-07 NOTE — Anesthesia Postprocedure Evaluation (Signed)
Anesthesia Post Note  Patient: Jennifer Hammond  Procedure(s) Performed: URETEROSCOPY / RIGHT URETER STENT PLACEMENT / BLADDER BIOPSY (Right: Ureter)  Patient location during evaluation: PACU Anesthesia Type: General Level of consciousness: awake and alert Pain management: pain level controlled Vital Signs Assessment: post-procedure vital signs reviewed and stable Respiratory status: spontaneous breathing, nonlabored ventilation, respiratory function stable and patient connected to nasal cannula oxygen Cardiovascular status: blood pressure returned to baseline and stable Postop Assessment: no apparent nausea or vomiting Anesthetic complications: no   No notable events documented.   Last Vitals:  Vitals:   11/07/21 0945 11/07/21 1004  BP: 138/76 126/83  Pulse: 66 70  Resp: 16 18  Temp: (!) 36.3 C (!) 36.2 C  SpO2: 99% 96%    Last Pain:  Vitals:   11/07/21 1004  TempSrc: Temporal  PainSc: [place]

## 2021-11-07 NOTE — Anesthesia Preprocedure Evaluation (Addendum)
Anesthesia Evaluation  Patient identified by MRN, date of birth, ID band Patient awake    Reviewed: Allergy & Precautions, NPO status , Patient's Chart, lab work & pertinent test results  History of Anesthesia Complications (+) PONV and history of anesthetic complications  Airway Mallampati: III  TM Distance: >3 FB Neck ROM: full    Dental  (+) Chipped   Pulmonary neg pulmonary ROS, neg shortness of breath,    Pulmonary exam normal        Cardiovascular Exercise Tolerance: Good (-) angina(-) Past MI and (-) DOE Normal cardiovascular exam     Neuro/Psych  Headaches, PSYCHIATRIC DISORDERS    GI/Hepatic Neg liver ROS, GERD  Controlled,  Endo/Other  negative endocrine ROS  Renal/GU      Musculoskeletal   Abdominal   Peds  Hematology negative hematology ROS (+)   Anesthesia Other Findings Past Medical History: No date: Anxiety 2012: Cancer ([place])     Comment:  MELANOMA No date: Complication of anesthesia No date: Endometriosis No date: GERD (gastroesophageal reflux disease) No date: Headache     Comment:  migraines No date: Heart murmur     Comment:  asymptomatic No date: MVP (mitral valve prolapse)     Comment:  ASYMPTOMATIC-NO MEDS-DOES NOT SEE CARDIOLOGIST-LAST ECHO              IN 2016 No date: Osteoarthritis     Comment:  hands and neck No date: Osteoporosis No date: PONV (postoperative nausea and vomiting)     Comment:  nausea only  Past Surgical History: 1991: ABDOMINAL HYSTERECTOMY     Comment:  TAH,BSO 07/18/2021: CYSTOSCOPY W/ RETROGRADES; N/A     Comment:  Procedure: CYSTOSCOPY WITH RETROGRADE PYELOGRAM;                Surgeon: Royston Cowper, MD;  Location: [facility];                Service: Urology;  Laterality: N/A; 04/20/2018: CYSTOSCOPY WITH BIOPSY; N/A     Comment:  Procedure: CYSTOSCOPY WITH BLADDER BIOPSY;  Surgeon:               Royston Cowper, MD;  Location: [facility];  Service:                Urology;  Laterality: N/A; 07/18/2021: CYSTOSCOPY WITH BIOPSY; N/A     Comment:  Procedure: CYSTOSCOPY WITH BIOPSY;  Surgeon: Royston Cowper, MD;  Location: [facility];  Service: Urology;                Laterality: N/A; 2012: EXCISION OF MELANOMA 2011: FACIAL COSMETIC SURGERY No date: FRACTURE SURGERY     Comment:  knee fracture No date: JOINT REPLACEMENT; Left     Comment:  HIP No date: OOPHORECTOMY     Comment:  BSO 1990: PELVIC LAPAROSCOPY     Comment:  DIAGNOSTIC LAP No date: TOTAL HIP ARTHROPLASTY 10/27/2017: TRANSURETHRAL RESECTION OF BLADDER TUMOR WITH MITOMYCIN- C; N/A     Comment:  Procedure: TRANSURETHRAL RESECTION OF BLADDER TUMOR WITH              MITOMYCIN-C;  Surgeon: Royston Cowper, MD;  Location:               [facility];  Service: Urology;  Laterality: N/A;  BMI    Body Mass Index: 20.43 kg/m      Reproductive/Obstetrics negative  OB ROS                             Anesthesia Physical Anesthesia Plan  ASA: 3  Anesthesia Plan: General ETT   Post-op Pain Management:    Induction: Intravenous  PONV Risk Score and Plan: Ondansetron, Dexamethasone, Midazolam, Treatment may vary due to age or medical condition, Propofol infusion and TIVA  Airway Management Planned: Oral ETT  Additional Equipment:   Intra-op Plan:   Post-operative Plan: Extubation in OR  Informed Consent: I have reviewed the patients History and Physical, chart, labs and discussed the procedure including the risks, benefits and alternatives for the proposed anesthesia with the patient or authorized representative who has indicated his/her understanding and acceptance.     Dental Advisory Given  Plan Discussed with: Anesthesiologist, CRNA and Surgeon  Anesthesia Plan Comments: (Patient consented for risks of anesthesia including but not limited to:  - adverse reactions to medications - damage to eyes, teeth, lips or other oral  mucosa - nerve damage due to positioning  - sore throat or hoarseness - Damage to heart, brain, nerves, lungs, other parts of body or loss of life  Patient voiced understanding.)       Anesthesia Quick Evaluation

## 2021-11-07 NOTE — Anesthesia Procedure Notes (Signed)
Procedure Name: Intubation Date/Time: 11/07/2021 7:41 AM  Performed by: Aline Brochure, CRNAPre-anesthesia Checklist: Patient identified, Patient being monitored, Timeout performed, Emergency Drugs available and Suction available Patient Re-evaluated:Patient Re-evaluated prior to induction Oxygen Delivery Method: Circle system utilized Preoxygenation: Pre-oxygenation with 100% oxygen Induction Type: IV induction Ventilation: Mask ventilation without difficulty Laryngoscope Size: Mac and 3 Grade View: Grade I Tube type: Oral Tube size: 7.0 mm Number of attempts: 1 Airway Equipment and Method: Stylet and Video-laryngoscopy Placement Confirmation: ETT inserted through vocal cords under direct vision, positive ETCO2 and breath sounds checked- equal and bilateral Secured at: 20 cm Tube secured with: Tape Dental Injury: Teeth and Oropharynx as per pre-operative assessment

## 2021-11-07 NOTE — Transfer of Care (Signed)
Immediate Anesthesia Transfer of Care Note  Patient: Jennifer Hammond  Procedure(s) Performed: URETEROSCOPY WITH HOLMIUM LASER LITHOTRIPSY (Right)  Patient Location: PACU  Anesthesia Type:General  Level of Consciousness: drowsy  Airway & Oxygen Therapy: Patient Spontanous Breathing and Patient connected to face mask oxygen  Post-op Assessment: Report given to RN and Post -op Vital signs reviewed and stable  Post vital signs: Reviewed and stable  Last Vitals:  Vitals Value Taken Time  BP 101/77 11/07/21 0916  Temp    Pulse 65 11/07/21 0917  Resp 13 11/07/21 0917  SpO2 95 % 11/07/21 0917  Vitals shown include unvalidated device data.  Last Pain:  Vitals:   11/07/21 0619  TempSrc: Oral  PainSc: 1          Complications: No notable events documented.

## 2021-11-07 NOTE — Op Note (Signed)
Preoperative diagnosis: 1.  Gross hematuria (R31.0)                                           2.  Right lower abdominal pain radiating to flank (N23)                                           3.  History of bladder cancer (Z85.51)  Postoperative diagnosis: 1.  Gross hematuria (R31.0)                                             2.  Right lower abdominal pain radiating to flank (N23)                                             3.  History of bladder cancer                                             4.  Mild right mid ureteral stricture N13.1)                                             5.  Possible bladder tumor versus Hunner's ulcer N32.89)                                             Procedure: 1.  Right ureteroscopy with fluoroscopy (CPT [phone])                     2.  Right ureteral stent placement (CPT 24268)                     3.  Bladder biopsy with fulguration (cpt [phone])  Surgeon: Otelia Limes. Yves Dill MD  Anesthesia: General  Indications:See the history and physical also.  66 year old (DATE OF BIRTH: 05/16/1955) white female, who has had recurrent gross hematuria for the past month.  She has also noted right lower quadrant discomfort that radiates to her right flank.  CT scan on June 28th did not find any sources for the hematuria and there was no hydronephrosis.  She did have a urine cytology on June 19th, which revealed dysplasia and calcium oxalate crystals.  She underwent cystoscopy with random bladder biopsies and bilateral retrogrades in April, revealing no filling defects in the collecting system and bladder biopsy showed atypia.  She comes in now for cystoscopy with right ureteroscopy with possible holmium lithotripsy of kidney stone if present and/or possible biopsy if urothelial lesion is present.After informed consent the above procedure(s) were requested     Technique and findings: After adequate general anesthesia been obtained the patient was placed in dorsolithotomy position  and perineum was prepped and draped in the usual fashion.  The 21 French cystoscope sheath was advanced into the bladder with the obturator in place.  The cystoscope was coupled with the camera and then placed into the sheath.  The bladder was then thoroughly inspected.  Both ureteral orifices were identified and had clear efflux.  A mid posterior bladder area that had stellate appearance with central redness was identified and cold cup biopsy performed.  The area was then cauterized with the Bugbee electrode.  An additional area of erythema of the posterior bladder wall was cauterized with the Bugbee electrode.  At this point a 0.035 Glidewire was advanced up the right ureter under fluoroscopic guidance.  The intramural ureter was dilated to 5 mm with balloon dilator.  The flexible ureteroscope was then advanced up the right ureter.  There was some narrowing of the distal ureter in the region of large quantity surgical clips that barely allowed passage of the flexible ureteroscope.  No ureteral tumors were identified.  Ureteroscope was then advanced up to the renal pelvis and no intrarenal tumors were identified.  The ureteroscope was then removed and the cystoscope backloaded over the guidewire.  A 6 x 24 cm ureteral stent with retrieval suture was advanced over the guidewire and positioned in the ureter using fluoroscopic guidance.  Guidewire was then removed taking care to leave the stent in position.  The bladder was then drained and cystoscope was removed.  10 cc of viscous Xylocaine was then instilled within the urethra and the bladder.  Blood loss was minimal.  The procedure was then terminated and patient transferred to the recovery room in stable condition.

## 2021-11-08 LAB — SURGICAL PATHOLOGY

## 2021-12-05 ENCOUNTER — Ambulatory Visit: Payer: BC Managed Care – PPO

## 2021-12-05 ENCOUNTER — Ambulatory Visit
Admission: RE | Admit: 2021-12-05 | Discharge: 2021-12-05 | Disposition: A | Discharge status: Home / Self Care | Payer: BC Managed Care – PPO | Source: Ambulatory Visit | Attending: Gastroenterology | Admitting: Gastroenterology

## 2021-12-05 DIAGNOSIS — R932 Abnormal findings on diagnostic imaging of liver and biliary tract: Secondary | ICD-10-CM | POA: Diagnosis not present

## 2021-12-05 DIAGNOSIS — K7689 Other specified diseases of liver: Secondary | ICD-10-CM | POA: Diagnosis not present

## 2021-12-06 DIAGNOSIS — R932 Abnormal findings on diagnostic imaging of liver and biliary tract: Secondary | ICD-10-CM | POA: Diagnosis not present

## 2021-12-06 DIAGNOSIS — R319 Hematuria, unspecified: Secondary | ICD-10-CM | POA: Diagnosis not present

## 2021-12-10 DIAGNOSIS — R319 Hematuria, unspecified: Secondary | ICD-10-CM | POA: Diagnosis not present

## 2021-12-10 DIAGNOSIS — C689 Malignant neoplasm of urinary organ, unspecified: Secondary | ICD-10-CM | POA: Diagnosis not present

## 2021-12-10 DIAGNOSIS — R1900 Intra-abdominal and pelvic swelling, mass and lump, unspecified site: Secondary | ICD-10-CM | POA: Diagnosis not present

## 2021-12-12 ENCOUNTER — Other Ambulatory Visit: Payer: Self-pay | Admitting: Internal Medicine

## 2021-12-12 DIAGNOSIS — R1900 Intra-abdominal and pelvic swelling, mass and lump, unspecified site: Secondary | ICD-10-CM

## 2021-12-26 DIAGNOSIS — R3121 Asymptomatic microscopic hematuria: Secondary | ICD-10-CM | POA: Diagnosis not present

## 2021-12-26 DIAGNOSIS — Z8551 Personal history of malignant neoplasm of bladder: Secondary | ICD-10-CM | POA: Diagnosis not present

## 2021-12-26 DIAGNOSIS — R310 Gross hematuria: Secondary | ICD-10-CM | POA: Diagnosis not present

## 2022-01-09 ENCOUNTER — Ambulatory Visit
Admission: RE | Admit: 2022-01-09 | Discharge: 2022-01-09 | Disposition: A | Discharge status: Home / Self Care | Payer: BC Managed Care – PPO | Source: Ambulatory Visit | Attending: Internal Medicine | Admitting: Internal Medicine

## 2022-01-09 DIAGNOSIS — R1900 Intra-abdominal and pelvic swelling, mass and lump, unspecified site: Secondary | ICD-10-CM

## 2022-01-09 MED ORDER — GADOBENATE DIMEGLUMINE 529 MG/ML IV SOLN
10.0000 mL | Freq: Once | INTRAVENOUS | Status: AC | PRN
  Administered 2022-01-09: 10 mL via INTRAVENOUS

## 2022-01-15 ENCOUNTER — Ambulatory Visit (INDEPENDENT_AMBULATORY_CARE_PROVIDER_SITE_OTHER): Payer: Self-pay | Admitting: Nurse Practitioner

## 2022-01-15 DIAGNOSIS — Z23 Encounter for immunization: Secondary | ICD-10-CM

## 2022-02-28 ENCOUNTER — Other Ambulatory Visit: Payer: Self-pay | Admitting: Nurse Practitioner

## 2022-02-28 DIAGNOSIS — N952 Postmenopausal atrophic vaginitis: Secondary | ICD-10-CM

## 2022-02-28 NOTE — Telephone Encounter (Signed)
Last AEX 05/03/2021--scheduled for 05/05/2022. Last mammo 10/30/2021-WNL

## 2022-04-01 DIAGNOSIS — N3090 Cystitis, unspecified without hematuria: Secondary | ICD-10-CM | POA: Diagnosis not present

## 2022-04-01 DIAGNOSIS — R310 Gross hematuria: Secondary | ICD-10-CM | POA: Diagnosis not present

## 2022-04-01 DIAGNOSIS — C689 Malignant neoplasm of urinary organ, unspecified: Secondary | ICD-10-CM | POA: Diagnosis not present

## 2022-04-04 DIAGNOSIS — N3090 Cystitis, unspecified without hematuria: Secondary | ICD-10-CM | POA: Diagnosis not present

## 2022-04-16 DIAGNOSIS — C689 Malignant neoplasm of urinary organ, unspecified: Secondary | ICD-10-CM | POA: Diagnosis not present

## 2022-04-29 DIAGNOSIS — R310 Gross hematuria: Secondary | ICD-10-CM | POA: Diagnosis not present

## 2022-05-05 ENCOUNTER — Encounter: Payer: Self-pay | Admitting: Nurse Practitioner

## 2022-05-05 ENCOUNTER — Ambulatory Visit (INDEPENDENT_AMBULATORY_CARE_PROVIDER_SITE_OTHER): Payer: PPO | Admitting: Nurse Practitioner

## 2022-05-05 VITALS — BP 122/80 | HR 78 | Ht 63.0 in | Wt 124.0 lb

## 2022-05-05 DIAGNOSIS — Z780 Asymptomatic menopausal state: Secondary | ICD-10-CM | POA: Diagnosis not present

## 2022-05-05 DIAGNOSIS — Z01419 Encounter for gynecological examination (general) (routine) without abnormal findings: Secondary | ICD-10-CM | POA: Diagnosis not present

## 2022-05-05 DIAGNOSIS — M810 Age-related osteoporosis without current pathological fracture: Secondary | ICD-10-CM

## 2022-05-05 DIAGNOSIS — N952 Postmenopausal atrophic vaginitis: Secondary | ICD-10-CM | POA: Diagnosis not present

## 2022-05-05 MED ORDER — ESTRING 7.5 MCG/24HR VA RING: VAGINAL_RING | VAGINAL | 3 refills | Status: DC

## 2022-05-05 NOTE — Progress Notes (Signed)
Jennifer Hammond [birth year] [id number]   History:  67 y.o. G0 presents for breast and pelvic exam. Postmenopausal - using Estring for vaginal dryness and dyspareunia with good relief. She stopped Estring when diagnosed with bladder cancer in 2019, but due to discomfort and failed treatments, urology was comfortable with restarting. Also using estradiol cream and coconut oil externally. 10/2021 urothelial carcinoma found after patient was experiencing persistent hematuria. Being managed by [facility] with plans to start induction BCG, 2019 bladder cancer with TUR, 2012 melanoma. S/P 1991 TAH BSO for endometriosis. Osteoporosis, was on Fosamax for 5 years, managed by PCP.   Gynecologic History No LMP recorded. Patient has had a hysterectomy.   Sexually active: No  Health maintenance Last Pap: 2012. Results were: Normal Last mammogram: 10/30/2021. Results were: Normal Last colonoscopy: 10/2018. Results were: diverticulosis, 5-year repeat recommended Last Dexa: 07/2019. Results were: T-score -2.6, FRAX 14% / 1.5%  Past medical history, past surgical history, family history and social history were all reviewed and documented in the EPIC chart. Married. Retired in September. Daughter lives near [place]. 2 grandsons ages 11 and 59.   ROS:  A ROS was performed and pertinent positives and negatives are included.  Exam:  Vitals:   05/05/22 1512  BP: 122/80  Pulse: 78  Weight: 124 lb (56.2 kg)  Height: '5\' 3"'$  (1.6 m)     Body mass index is 21.97 kg/m.  General appearance:  Normal Thyroid:  Symmetrical, normal in size, without palpable masses or nodularity. Respiratory  Auscultation:  Clear without wheezing or rhonchi Cardiovascular  Auscultation:  Regular rate, without rubs, murmurs or gallops  Edema/varicosities:  Not grossly evident Abdominal  Soft,nontender, without masses, guarding or rebound.  Liver/spleen:  No organomegaly noted  Hernia:  None appreciated  Skin  Inspection:  Grossly  normal   Breasts: Examined lying and sitting.   Right: Without masses, retractions, discharge or axillary adenopathy.   Left: Without masses, retractions, discharge or axillary adenopathy. Genitourinary   Inguinal/mons:  Normal without inguinal adenopathy  External genitalia:  Normal appearing vulva with no masses, tenderness, or lesions. Atrophic changes  BUS/Urethra/Skene's glands:  Normal  Vagina:  Normal appearing with normal color and discharge, no lesions. Atrophic changes  Cervix: Absent  Uterus:  Absent  Adnexa/parametria:     Rt: Normal in size, without masses or tenderness.   Lt: Normal in size, without masses or tenderness.  Anus and perineum: Normal  Digital rectal exam: Normal sphincter tone without palpated masses or tenderness  Patient informed chaperone available to be present for breast and pelvic exam. Patient has requested no chaperone to be present. Patient has been advised what will be completed during breast and pelvic exam.   Assessment/Plan:  67 y.o. G0 for breast and pelvic exam.   Encounter for breast and pelvic examination - Education provided on SBEs, importance of preventative screenings, current guidelines, high calcium diet, regular exercise, and multivitamin daily. Labs with PCP.   Postmenopausal - S/P 1991 TAH BSO for endometriosis.   Vaginal atrophy - Plan: estradiol (ESTRING) 2 MG vaginal ring with good relief. Urologist aware of use and agrees for management of symptoms.  She is aware of the slight risk of systemic absorption with increasing risk for blood clots, heart attack, stroke, and breast cancer.  She would like to continue.  Refill x1 year provided. She is also using vaginal estrogen cream externally. We also discussed coconut oil daily to help with hydration.   Age-related osteoporosis without current pathological fracture -  osteoporosis managed by PCP. Was on Fosamax in the past. Continue Vitamin D + Calcium and regular exercise. Recommend  repeating DXA and she plans to discuss with PCP at her annual visit.   Screening for cervical cancer - Normal Pap history. No longer screening per guidelines.   Screening for breast cancer - Normal mammogram history.  Breast exam normal today.  Continue annual screenings.  Screening for colon cancer - 2020 colonoscopy. Will repeat at 5-year interval per guidelines.   Follow-up in 1 year for medication management.     Tamela Gammon [facility], 4:22 PM 05/05/2022

## 2022-05-14 DIAGNOSIS — C689 Malignant neoplasm of urinary organ, unspecified: Secondary | ICD-10-CM | POA: Diagnosis not present

## 2022-05-21 DIAGNOSIS — C689 Malignant neoplasm of urinary organ, unspecified: Secondary | ICD-10-CM | POA: Diagnosis not present

## 2022-05-28 DIAGNOSIS — C689 Malignant neoplasm of urinary organ, unspecified: Secondary | ICD-10-CM | POA: Diagnosis not present

## 2022-06-04 DIAGNOSIS — C689 Malignant neoplasm of urinary organ, unspecified: Secondary | ICD-10-CM | POA: Diagnosis not present

## 2022-06-11 DIAGNOSIS — C689 Malignant neoplasm of urinary organ, unspecified: Secondary | ICD-10-CM | POA: Diagnosis not present

## 2022-06-18 DIAGNOSIS — C689 Malignant neoplasm of urinary organ, unspecified: Secondary | ICD-10-CM | POA: Diagnosis not present

## 2022-07-16 DIAGNOSIS — C689 Malignant neoplasm of urinary organ, unspecified: Secondary | ICD-10-CM | POA: Diagnosis not present

## 2022-07-29 DIAGNOSIS — Z0000 Encounter for general adult medical examination without abnormal findings: Secondary | ICD-10-CM | POA: Diagnosis not present

## 2022-07-29 DIAGNOSIS — R7309 Other abnormal glucose: Secondary | ICD-10-CM | POA: Diagnosis not present

## 2022-07-29 DIAGNOSIS — Z79899 Other long term (current) drug therapy: Secondary | ICD-10-CM | POA: Diagnosis not present

## 2022-08-04 ENCOUNTER — Other Ambulatory Visit: Payer: Self-pay | Admitting: Internal Medicine

## 2022-08-04 DIAGNOSIS — C689 Malignant neoplasm of urinary organ, unspecified: Secondary | ICD-10-CM

## 2022-09-11 DIAGNOSIS — E782 Mixed hyperlipidemia: Secondary | ICD-10-CM | POA: Diagnosis not present

## 2022-09-11 DIAGNOSIS — R0789 Other chest pain: Secondary | ICD-10-CM | POA: Diagnosis not present

## 2022-09-11 DIAGNOSIS — M5010 Cervical disc disorder with radiculopathy, unspecified cervical region: Secondary | ICD-10-CM | POA: Diagnosis not present

## 2022-09-11 DIAGNOSIS — F330 Major depressive disorder, recurrent, mild: Secondary | ICD-10-CM | POA: Diagnosis not present

## 2022-09-11 DIAGNOSIS — C689 Malignant neoplasm of urinary organ, unspecified: Secondary | ICD-10-CM | POA: Diagnosis not present

## 2022-09-11 DIAGNOSIS — Z8249 Family history of ischemic heart disease and other diseases of the circulatory system: Secondary | ICD-10-CM | POA: Diagnosis not present

## 2022-09-11 DIAGNOSIS — Z0000 Encounter for general adult medical examination without abnormal findings: Secondary | ICD-10-CM | POA: Diagnosis not present

## 2022-09-12 ENCOUNTER — Other Ambulatory Visit: Payer: Self-pay | Admitting: Internal Medicine

## 2022-09-12 DIAGNOSIS — Z8249 Family history of ischemic heart disease and other diseases of the circulatory system: Secondary | ICD-10-CM

## 2022-09-12 DIAGNOSIS — Z0000 Encounter for general adult medical examination without abnormal findings: Secondary | ICD-10-CM

## 2022-09-17 ENCOUNTER — Ambulatory Visit
Admission: RE | Admit: 2022-09-17 | Discharge: 2022-09-17 | Disposition: A | Discharge status: Home / Self Care | Payer: PPO | Source: Ambulatory Visit | Attending: Internal Medicine | Admitting: Internal Medicine

## 2022-09-17 DIAGNOSIS — Z8249 Family history of ischemic heart disease and other diseases of the circulatory system: Secondary | ICD-10-CM | POA: Insufficient documentation

## 2022-09-17 DIAGNOSIS — Z0000 Encounter for general adult medical examination without abnormal findings: Secondary | ICD-10-CM | POA: Insufficient documentation

## 2022-10-06 DIAGNOSIS — R0789 Other chest pain: Secondary | ICD-10-CM | POA: Diagnosis not present

## 2022-10-06 DIAGNOSIS — E782 Mixed hyperlipidemia: Secondary | ICD-10-CM | POA: Diagnosis not present

## 2022-10-15 DIAGNOSIS — R8289 Other abnormal findings on cytological and histological examination of urine: Secondary | ICD-10-CM | POA: Diagnosis not present

## 2022-10-15 DIAGNOSIS — C689 Malignant neoplasm of urinary organ, unspecified: Secondary | ICD-10-CM | POA: Diagnosis not present

## 2022-11-11 DIAGNOSIS — R300 Dysuria: Secondary | ICD-10-CM | POA: Diagnosis not present

## 2022-11-26 DIAGNOSIS — Z1231 Encounter for screening mammogram for malignant neoplasm of breast: Secondary | ICD-10-CM | POA: Diagnosis not present

## 2022-12-22 DIAGNOSIS — R109 Unspecified abdominal pain: Secondary | ICD-10-CM | POA: Diagnosis not present

## 2022-12-22 DIAGNOSIS — C689 Malignant neoplasm of urinary organ, unspecified: Secondary | ICD-10-CM | POA: Diagnosis not present

## 2023-01-14 DIAGNOSIS — Z85828 Personal history of other malignant neoplasm of skin: Secondary | ICD-10-CM | POA: Diagnosis not present

## 2023-01-14 DIAGNOSIS — Z85820 Personal history of malignant melanoma of skin: Secondary | ICD-10-CM | POA: Diagnosis not present

## 2023-01-14 DIAGNOSIS — D485 Neoplasm of uncertain behavior of skin: Secondary | ICD-10-CM | POA: Diagnosis not present

## 2023-01-14 DIAGNOSIS — D0359 Melanoma in situ of other part of trunk: Secondary | ICD-10-CM | POA: Diagnosis not present

## 2023-01-14 DIAGNOSIS — R208 Other disturbances of skin sensation: Secondary | ICD-10-CM | POA: Diagnosis not present

## 2023-01-14 DIAGNOSIS — D2261 Melanocytic nevi of right upper limb, including shoulder: Secondary | ICD-10-CM | POA: Diagnosis not present

## 2023-01-14 DIAGNOSIS — B078 Other viral warts: Secondary | ICD-10-CM | POA: Diagnosis not present

## 2023-01-14 DIAGNOSIS — D2272 Melanocytic nevi of left lower limb, including hip: Secondary | ICD-10-CM | POA: Diagnosis not present

## 2023-01-14 DIAGNOSIS — L570 Actinic keratosis: Secondary | ICD-10-CM | POA: Diagnosis not present

## 2023-01-14 DIAGNOSIS — D2262 Melanocytic nevi of left upper limb, including shoulder: Secondary | ICD-10-CM | POA: Diagnosis not present

## 2023-01-14 DIAGNOSIS — R238 Other skin changes: Secondary | ICD-10-CM | POA: Diagnosis not present

## 2023-01-14 DIAGNOSIS — D225 Melanocytic nevi of trunk: Secondary | ICD-10-CM | POA: Diagnosis not present

## 2023-01-21 DIAGNOSIS — R8289 Other abnormal findings on cytological and histological examination of urine: Secondary | ICD-10-CM | POA: Diagnosis not present

## 2023-01-21 DIAGNOSIS — C689 Malignant neoplasm of urinary organ, unspecified: Secondary | ICD-10-CM | POA: Diagnosis not present

## 2023-01-22 DIAGNOSIS — H43813 Vitreous degeneration, bilateral: Secondary | ICD-10-CM | POA: Diagnosis not present

## 2023-01-22 DIAGNOSIS — H2513 Age-related nuclear cataract, bilateral: Secondary | ICD-10-CM | POA: Diagnosis not present

## 2023-01-22 DIAGNOSIS — H5213 Myopia, bilateral: Secondary | ICD-10-CM | POA: Diagnosis not present

## 2023-01-27 DIAGNOSIS — D0359 Melanoma in situ of other part of trunk: Secondary | ICD-10-CM | POA: Diagnosis not present

## 2023-03-31 DIAGNOSIS — E782 Mixed hyperlipidemia: Secondary | ICD-10-CM | POA: Diagnosis not present

## 2023-03-31 DIAGNOSIS — E538 Deficiency of other specified B group vitamins: Secondary | ICD-10-CM | POA: Diagnosis not present

## 2023-04-06 ENCOUNTER — Other Ambulatory Visit: Payer: Self-pay

## 2023-04-06 DIAGNOSIS — N952 Postmenopausal atrophic vaginitis: Secondary | ICD-10-CM

## 2023-04-06 MED ORDER — ESTRING 7.5 MCG/24HR VA RING: VAGINAL_RING | VAGINAL | 0 refills | Status: DC

## 2023-04-06 NOTE — Telephone Encounter (Signed)
 Pt LVM in med refill line requesting refill & changed pharmacies.  Med refill request: Estring Last AEX: 05/05/2022-TW Next AEX: 05/19/2023-TW Last MMG (if hormonal med)10/2021-WNL Refill authorized: rx pend.

## 2023-04-06 NOTE — Telephone Encounter (Signed)
 Pt notified that rx was sent and voiced appreciation for the cb.

## 2023-04-08 DIAGNOSIS — E782 Mixed hyperlipidemia: Secondary | ICD-10-CM | POA: Diagnosis not present

## 2023-04-08 DIAGNOSIS — F32A Depression, unspecified: Secondary | ICD-10-CM | POA: Diagnosis not present

## 2023-04-29 DIAGNOSIS — C689 Malignant neoplasm of urinary organ, unspecified: Secondary | ICD-10-CM | POA: Diagnosis not present

## 2023-04-29 DIAGNOSIS — R8289 Other abnormal findings on cytological and histological examination of urine: Secondary | ICD-10-CM | POA: Diagnosis not present

## 2023-05-19 ENCOUNTER — Ambulatory Visit (INDEPENDENT_AMBULATORY_CARE_PROVIDER_SITE_OTHER): Payer: Medicare Other | Admitting: Nurse Practitioner

## 2023-05-19 ENCOUNTER — Encounter: Payer: Self-pay | Admitting: Nurse Practitioner

## 2023-05-19 VITALS — BP 92/60 | HR 74 | Ht 64.25 in | Wt 122.0 lb

## 2023-05-19 DIAGNOSIS — N958 Other specified menopausal and perimenopausal disorders: Secondary | ICD-10-CM

## 2023-05-19 DIAGNOSIS — N952 Postmenopausal atrophic vaginitis: Secondary | ICD-10-CM

## 2023-05-19 MED ORDER — ESTRING 7.5 MCG/24HR VA RING
VAGINAL_RING | VAGINAL | 3 refills | Status: AC
Start: 2023-05-19 — End: ?

## 2023-05-19 NOTE — Progress Notes (Signed)
   Acute Office Visit  Subjective:    Patient ID: Jennifer Hammond, female    DOB: 05/10/1955, 68 y.o.   MRN: [id number]   HPI 68 y.o. presents today for medication management. Postmenopausal - using Estring for severe vaginal dryness and atrophy. She stopped Estring when diagnosed with bladder cancer in 2019, but due to discomfort and failed treatments, urology was comfortable with restarting. Also coconut oil externally. 10/2021 urothelial carcinoma found after patient was experiencing persistent hematuria. Being managed by [facility]. S/P 1991 TAH BSO for endometriosis.   No LMP recorded. Patient has had a hysterectomy.    Review of Systems  Constitutional: Negative.   Genitourinary:  Positive for vaginal pain.       Objective:    Physical Exam Constitutional:      Appearance: Normal appearance.   GU: Not indicated  BP 92/60   Pulse 74   Ht 5' 4.25" (1.632 m)   Wt 122 lb (55.3 kg)   SpO2 96%   BMI 20.78 kg/m  Wt Readings from Last 3 Encounters:  05/19/23 122 lb (55.3 kg)  05/05/22 124 lb (56.2 kg)  11/07/21 119 lb (54 kg)        Assessment & Plan:   Problem List Items Addressed This Visit   None Visit Diagnoses       Vaginal atrophy    -  Primary   Relevant Medications   estradiol (ESTRING) 7.5 MCG/24HR vaginal ring     Genitourinary syndrome of menopause       Relevant Medications   estradiol (ESTRING) 7.5 MCG/24HR vaginal ring      Plan: Doing well on Estring. Urology aware and approves use. Continue coconut oil externally.   Return in about 1 year (around 05/18/2024) for B&P.    Olivia Mackie DNP, 2:09 PM 05/19/2023

## 2023-06-03 DIAGNOSIS — K080 Exfoliation of teeth due to systemic causes: Secondary | ICD-10-CM | POA: Diagnosis not present

## 2023-07-15 DIAGNOSIS — D2261 Melanocytic nevi of right upper limb, including shoulder: Secondary | ICD-10-CM | POA: Diagnosis not present

## 2023-07-15 DIAGNOSIS — D2272 Melanocytic nevi of left lower limb, including hip: Secondary | ICD-10-CM | POA: Diagnosis not present

## 2023-07-15 DIAGNOSIS — B078 Other viral warts: Secondary | ICD-10-CM | POA: Diagnosis not present

## 2023-07-15 DIAGNOSIS — L538 Other specified erythematous conditions: Secondary | ICD-10-CM | POA: Diagnosis not present

## 2023-07-15 DIAGNOSIS — R238 Other skin changes: Secondary | ICD-10-CM | POA: Diagnosis not present

## 2023-07-15 DIAGNOSIS — D2262 Melanocytic nevi of left upper limb, including shoulder: Secondary | ICD-10-CM | POA: Diagnosis not present

## 2023-07-15 DIAGNOSIS — D485 Neoplasm of uncertain behavior of skin: Secondary | ICD-10-CM | POA: Diagnosis not present

## 2023-07-15 DIAGNOSIS — D225 Melanocytic nevi of trunk: Secondary | ICD-10-CM | POA: Diagnosis not present

## 2023-07-15 DIAGNOSIS — C44719 Basal cell carcinoma of skin of left lower limb, including hip: Secondary | ICD-10-CM | POA: Diagnosis not present

## 2023-07-15 DIAGNOSIS — L570 Actinic keratosis: Secondary | ICD-10-CM | POA: Diagnosis not present

## 2023-07-29 DIAGNOSIS — C689 Malignant neoplasm of urinary organ, unspecified: Secondary | ICD-10-CM | POA: Diagnosis not present

## 2023-07-29 DIAGNOSIS — R8289 Other abnormal findings on cytological and histological examination of urine: Secondary | ICD-10-CM | POA: Diagnosis not present

## 2023-12-02 LAB — HM MAMMOGRAPHY

## 2023-12-04 ENCOUNTER — Encounter: Payer: Self-pay | Admitting: Nurse Practitioner

## 2024-02-15 ENCOUNTER — Ambulatory Visit: Payer: Self-pay

## 2024-02-15 DIAGNOSIS — Z09 Encounter for follow-up examination after completed treatment for conditions other than malignant neoplasm: Secondary | ICD-10-CM | POA: Diagnosis present

## 2024-02-15 DIAGNOSIS — Z860101 Personal history of adenomatous and serrated colon polyps: Secondary | ICD-10-CM | POA: Diagnosis not present

## 2024-02-15 DIAGNOSIS — K5730 Diverticulosis of large intestine without perforation or abscess without bleeding: Secondary | ICD-10-CM | POA: Diagnosis not present

## 2024-05-19 ENCOUNTER — Encounter: Admitting: Nurse Practitioner

## 2024-06-21 ENCOUNTER — Encounter: Admitting: Nurse Practitioner
